# Patient Record
Sex: Male | Born: 1970 | Race: Black or African American | Hispanic: No | Marital: Married | Smoking: Current some day smoker
Health system: Southern US, Community
[De-identification: ages and names within clinical notes are randomized; demographics above are authoritative.]

## PROBLEM LIST (undated history)

## (undated) DIAGNOSIS — E119 Type 2 diabetes mellitus without complications: Secondary | ICD-10-CM

## (undated) DIAGNOSIS — M2550 Pain in unspecified joint: Secondary | ICD-10-CM

## (undated) DIAGNOSIS — M109 Gout, unspecified: Secondary | ICD-10-CM

## (undated) DIAGNOSIS — I10 Essential (primary) hypertension: Secondary | ICD-10-CM

## (undated) DIAGNOSIS — E782 Mixed hyperlipidemia: Secondary | ICD-10-CM

## (undated) DIAGNOSIS — E559 Vitamin D deficiency, unspecified: Secondary | ICD-10-CM

## (undated) HISTORY — DX: Pain in unspecified joint: M25.50

## (undated) HISTORY — DX: Type 2 diabetes mellitus without complications: E11.9

## (undated) HISTORY — DX: Vitamin D deficiency, unspecified: E55.9

## (undated) HISTORY — PX: KNEE ARTHROSCOPY: SUR90

## (undated) HISTORY — PX: OTHER SURGICAL HISTORY: SHX169

## (undated) HISTORY — DX: Essential (primary) hypertension: I10

## (undated) HISTORY — DX: Gout, unspecified: M10.9

## (undated) HISTORY — DX: Mixed hyperlipidemia: E78.2

---

## 1998-07-28 ENCOUNTER — Encounter: Payer: Self-pay | Admitting: Emergency Medicine

## 1998-07-28 ENCOUNTER — Emergency Department (HOSPITAL_COMMUNITY): Admission: EM | Admit: 1998-07-28 | Discharge: 1998-07-28 | Payer: Self-pay | Admitting: Emergency Medicine

## 1998-07-29 ENCOUNTER — Encounter: Admission: RE | Admit: 1998-07-29 | Discharge: 1998-07-29 | Payer: Self-pay | Admitting: *Deleted

## 1998-07-30 ENCOUNTER — Encounter: Admission: RE | Admit: 1998-07-30 | Discharge: 1998-10-28 | Payer: Self-pay | Admitting: Internal Medicine

## 1998-08-09 ENCOUNTER — Emergency Department (HOSPITAL_COMMUNITY): Admission: EM | Admit: 1998-08-09 | Discharge: 1998-08-09 | Payer: Self-pay | Admitting: Emergency Medicine

## 1999-06-25 ENCOUNTER — Emergency Department (HOSPITAL_COMMUNITY): Admission: EM | Admit: 1999-06-25 | Discharge: 1999-06-25 | Payer: Self-pay | Admitting: Emergency Medicine

## 1999-06-25 ENCOUNTER — Encounter: Payer: Self-pay | Admitting: Emergency Medicine

## 1999-12-30 ENCOUNTER — Emergency Department (HOSPITAL_COMMUNITY): Admission: EM | Admit: 1999-12-30 | Discharge: 1999-12-30 | Payer: Self-pay | Admitting: Emergency Medicine

## 1999-12-30 ENCOUNTER — Encounter: Payer: Self-pay | Admitting: Emergency Medicine

## 2000-04-14 ENCOUNTER — Emergency Department (HOSPITAL_COMMUNITY): Admission: EM | Admit: 2000-04-14 | Discharge: 2000-04-15 | Payer: Self-pay | Admitting: Emergency Medicine

## 2000-04-20 ENCOUNTER — Encounter: Payer: Self-pay | Admitting: *Deleted

## 2000-04-20 ENCOUNTER — Ambulatory Visit (HOSPITAL_COMMUNITY): Admission: RE | Admit: 2000-04-20 | Discharge: 2000-04-20 | Payer: Self-pay | Admitting: *Deleted

## 2000-04-21 ENCOUNTER — Observation Stay (HOSPITAL_COMMUNITY): Admission: RE | Admit: 2000-04-21 | Discharge: 2000-04-23 | Payer: Self-pay | Admitting: *Deleted

## 2000-04-21 ENCOUNTER — Encounter: Payer: Self-pay | Admitting: *Deleted

## 2000-04-21 ENCOUNTER — Encounter (INDEPENDENT_AMBULATORY_CARE_PROVIDER_SITE_OTHER): Payer: Self-pay | Admitting: Specialist

## 2000-05-02 ENCOUNTER — Encounter (INDEPENDENT_AMBULATORY_CARE_PROVIDER_SITE_OTHER): Payer: Self-pay | Admitting: Specialist

## 2000-05-02 ENCOUNTER — Inpatient Hospital Stay (HOSPITAL_COMMUNITY): Admission: RE | Admit: 2000-05-02 | Discharge: 2000-05-06 | Payer: Self-pay | Admitting: *Deleted

## 2000-06-13 ENCOUNTER — Ambulatory Visit (HOSPITAL_COMMUNITY): Admission: RE | Admit: 2000-06-13 | Discharge: 2000-06-13 | Payer: Self-pay | Admitting: *Deleted

## 2000-06-13 ENCOUNTER — Encounter: Payer: Self-pay | Admitting: *Deleted

## 2007-04-25 ENCOUNTER — Emergency Department (HOSPITAL_COMMUNITY): Admission: EM | Admit: 2007-04-25 | Discharge: 2007-04-25 | Payer: Self-pay | Admitting: Emergency Medicine

## 2008-03-12 ENCOUNTER — Ambulatory Visit (HOSPITAL_COMMUNITY): Admission: RE | Admit: 2008-03-12 | Discharge: 2008-03-12 | Payer: Self-pay | Admitting: Internal Medicine

## 2008-06-11 IMAGING — CR DG FOOT COMPLETE 3+V*L*
3 series · 3 of 3 positions shown · non-contrast
Comparison: None.

CLINICAL DATA: Trauma 1 week ago.

LEFT FOOT - COMPLETE 3+ VIEW

[view not recorded (1 of 3)]
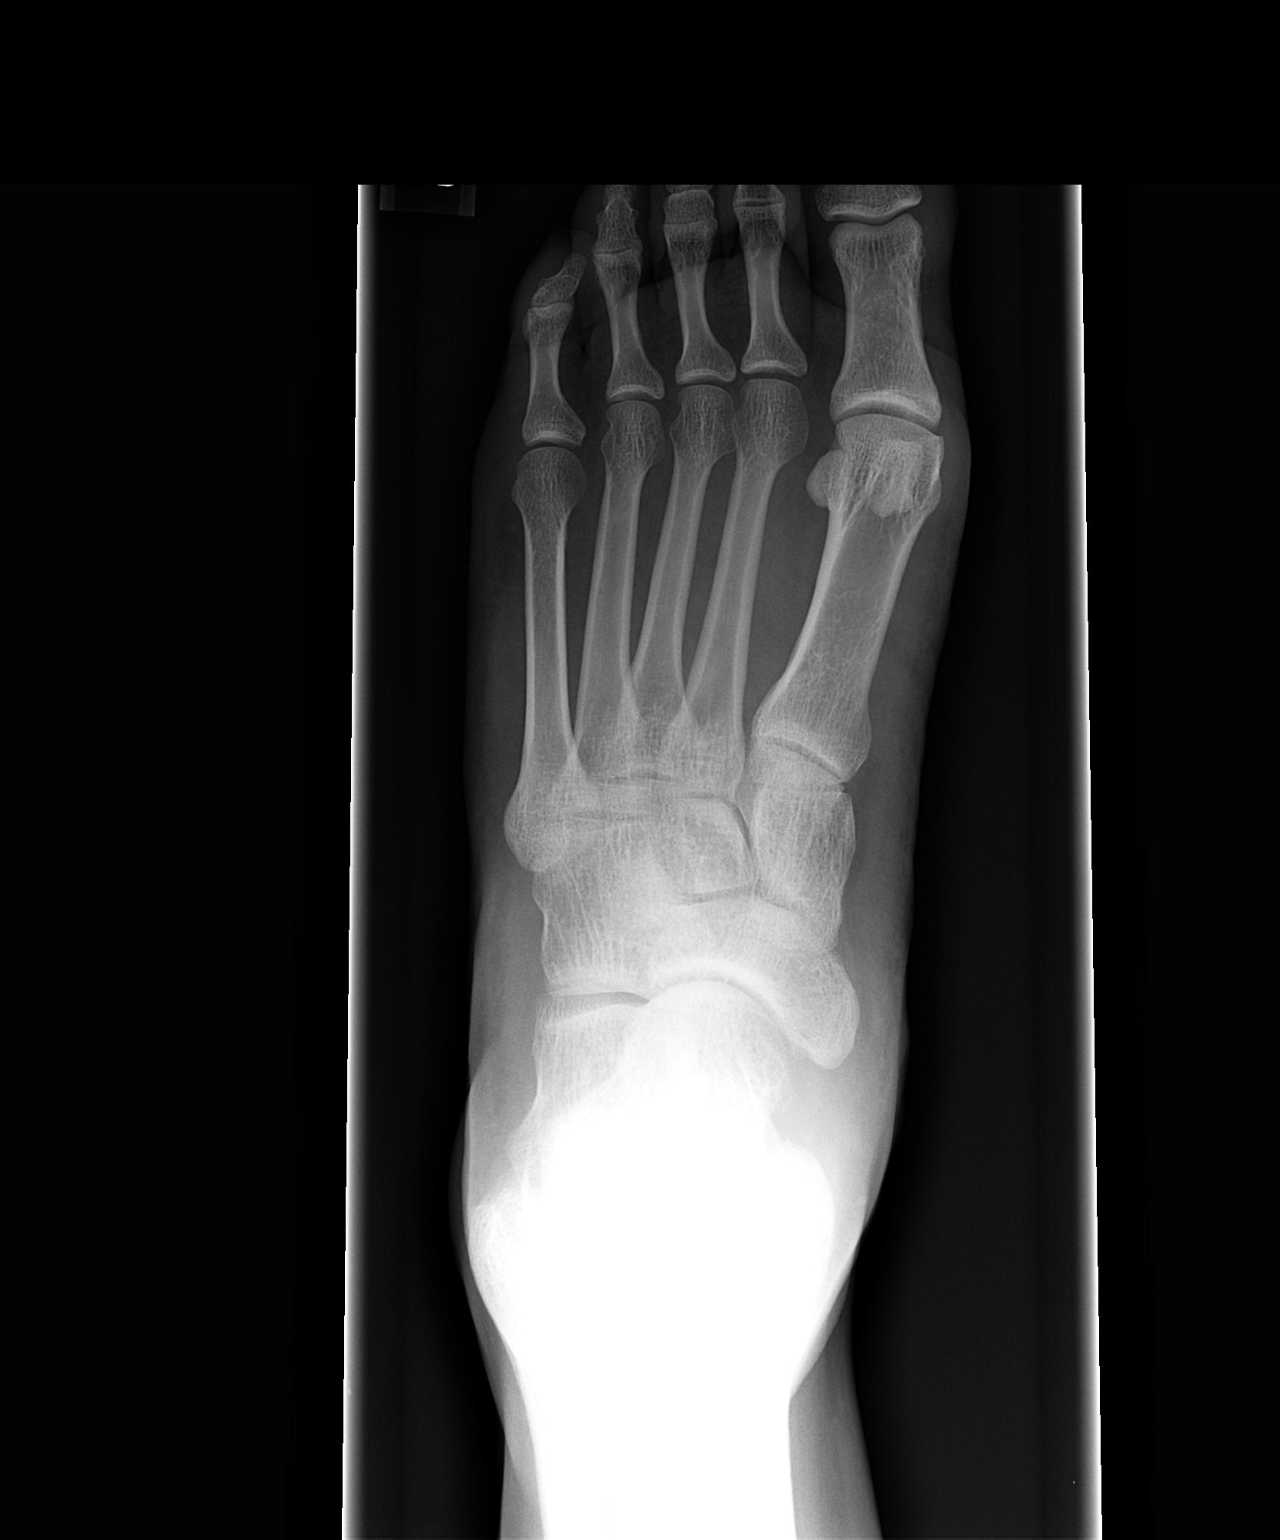

[view not recorded (2 of 3)]
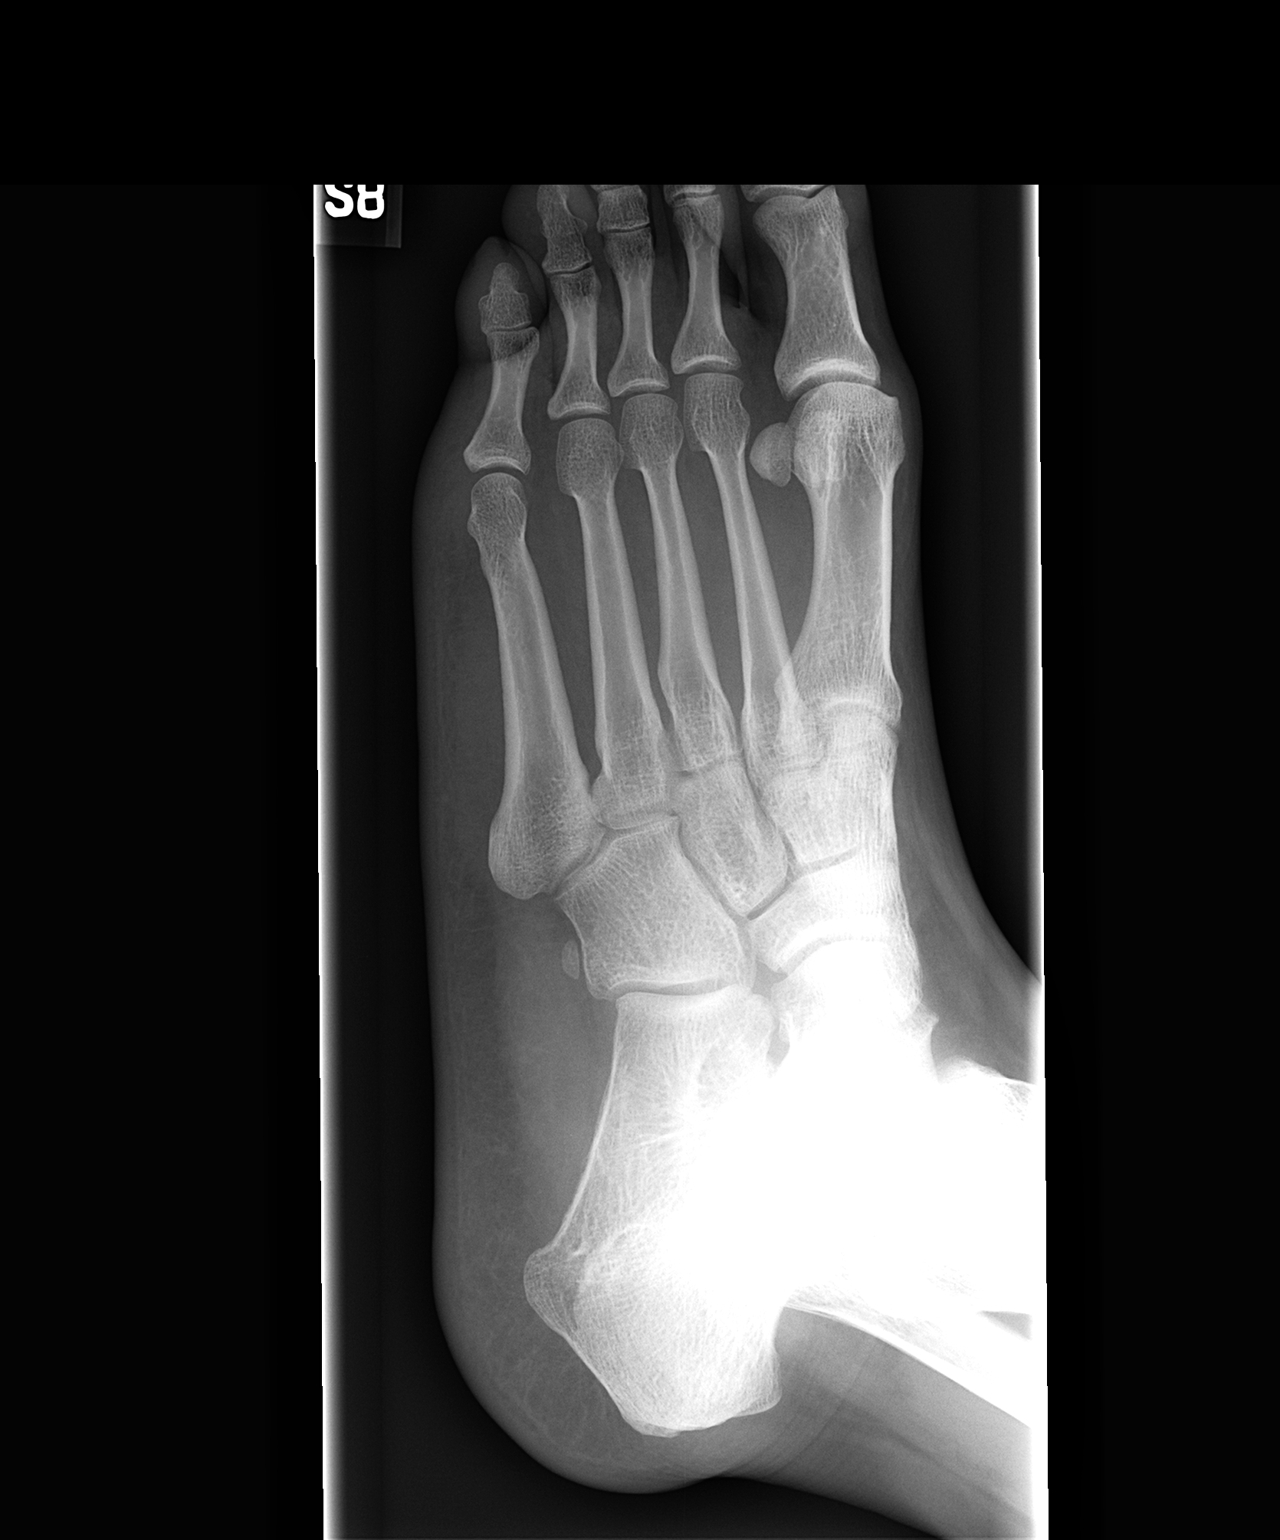

[view not recorded (3 of 3)]
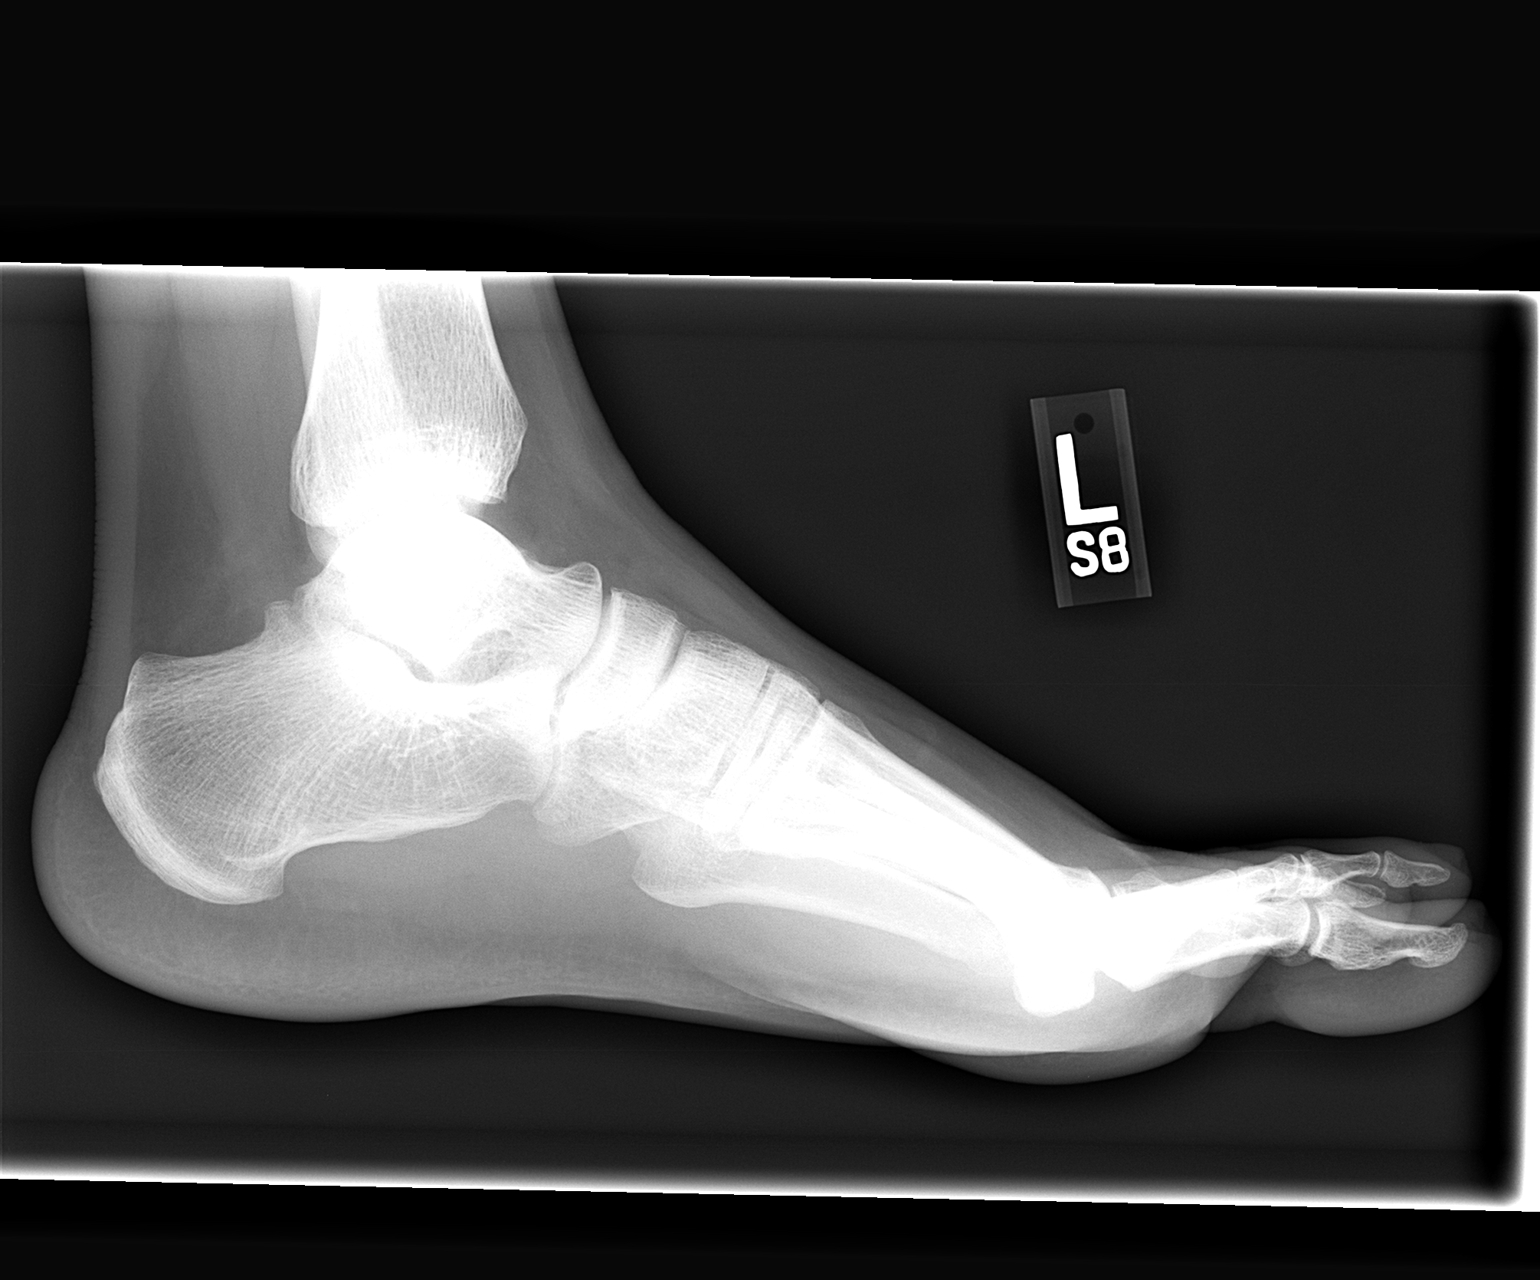

[3 of 3 positions shown; findings below may reference images not displayed]

FINDINGS: No fracture or dislocation.
IMPRESSION: No fracture or dislocation.

## 2011-03-19 NOTE — Discharge Summary (Signed)
[facility]  Patient:    Glen Cuevas, Glen Cuevas                MRN: [id number] Adm. Date:  [id number] Disc. Date: [id number] Attending:  Dalbert Mayotte CCRadene Knee., M.D.                           Discharge Summary  HOSPITAL COURSE:  This 40 year old male with a chief complaint of hematuria, onset January 2001, was admitted for partial cystectomy.  The patient had been previously cystoscoped and biopsied on April 21, 2000 and was found to have a well differentiated large tumor in the left anterior dome which was not possible to reach with the resectoscope.  For that reason the patient was brought back in and on May 02, 2000, a partial cystectomy was carried out with adequate margins around the base of the tumor which was about 2 cm in thickness.  The pathology showed a well differentiated tumor with no evidence of invasion.  There were no additional tumors noted at cystoscopy or at the time of the surgery.  Postoperatively the patient did well.  He was treated initially with Kefzol IV and then with p.o. Cipro.  He was given Jacky Kindle for pain and Tylox for less severe pain.  The patient was ambulated.  His urine cleared after 24 to 48 hours and the patients bowels moved.  He was eating a regular diet and had a low grade temperature which resolved after July 5.  He was discharged on May 06, 2000 afebrile and stable.  PAST MEDICAL HISTORY:  The past history, review of systems and family history reveals that the patient has no known allergies.  He has been on no regular medications except for his antibiotics and his pain meds.  He has had no previous operations or serious illnesses.  His weight has been steady about 223.  REVIEW OF SYSTEMS:  Unremarkable except for a little discomfort in his chest which was investigated and found to have been noncardiac, felt to be related to stress.  FAMILY HISTORY:  Positive for stones  in his mother and there is no diabetes or familial illnesses known to be in the family.  SOCIAL HISTORY:  The patient is single.  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  He quit smoking a couple of weeks ago.  He does not abuse alcohol.  PHYSICAL EXAMINATION:  GENERAL:  Examination reveals a well-developed, well-nourished 40 year old male.  VITAL SIGNS:  Blood pressure 124/85, the other vital signs normal.  HEENT:  Unremarkable.  CARDIORESPIRATORY:  No murmur detected.  He has an occasional rale.  ABDOMEN:  Mildly obese.  A little sensitivity over the left 12th rib but his bone scan was negative as was his IVP performed prior to the biopsy.  GENITALIA:  Uncircumcised penis.  Testes are good size and symmetrical.  The rectal tone reveals a 20 gram soft, symmetrical prostate.  EXTREMITIES:  Unremarkable.  NEUROLOGICAL:  Unremarkable.  LABORATORY DATA:  The laboratory and x-ray reports reveal that the bladder tumor was a low grade papillary transitional cell carcinoma, no invasion.  The postoperative hematocrit was 39, hemoglobin 13.4, white count 9,600.  His chemistries were normal.  Creatinine 1.5.  The patients blood type was O positive.  His urine culture showed no growth.  The patients chest x-ray, IVP and  bone scan done at the time of the biopsy were all within normal limits.  FINAL DIAGNOSIS:  Papillary transitional cell carcinoma, grade 1, noninvasive.  OPERATION:  Partial cystectomy.  PLAN:  Regular diet.  Force fluids.  Light activity.  DISCHARGE MEDICATIONS: 1. Tylox 1 q.4-6h. p.r.n. pain. 2. Cipro 500 b.i.d. 3. Ditropan 5 mg q.4-6h. p.r.n. bladder spasm.  DISCHARGE INSTRUCTIONS:  Return to office in one week for removal of the remainder of his staples, his Penrose drain and Foley catheter.  CONDITION:  Improved. DD:  05/06/00 TD:  05/06/00 Job: [id number]

## 2011-03-19 NOTE — H&P (Signed)
[facility]  Patient:    Glen Cuevas, Glen Cuevas                MRN: [id number] Adm. Date:  [id number] Attending:  Dalbert Mayotte CCRadene Knee., M.D.                         History and Physical  REASON FOR ADMISSION:  This 40 year old male with a chief complaint of bladder tumor is scheduled for 02 May 2000, for partial cystectomy at [facility].  HISTORY OF PRESENT ILLNESS:  This 40 year old male was seen initially in our office on 19 April 2000, and reported that he had had at least three episodes of blood in the urine associated with clots and some discomfort in his left side.  These started in January of 2001.  He had no previous history of stone or urinary tract infection.  The patient was investigated with CT scan which showed no stone, and then he had an IVP which suggested a mass in the bladder, which was large.  For that reason, the patient had cystoscopy and biopsy carried out at [facility] on 21 April 2000, and was found to have a large bladder tumor high in the left anterior dome with a base which could not be reached with a resectoscope.  Biopsies were taken, however, and revealed a well-differentiated transitional cell carcinoma.  No elements of invasion could be identified but the bladder wall and tumor base could not be reached for biopsy.  Postoperatively, the patient did well on Levaquin, gentamicin and his Foley catheter was removed on 22 April 2000.  The patient was discharged 23 April 2000, voiding well, no excess bleeding, and will return for partial cystectomy on 02 May 2000.  The patient did have a cystogram performed postoperatively which confirmed that this was a large tumor in the anterior left dome with a fairly broad base.  The patient also had a bone scan which showed no evidence of metastases and the explantation for his pain, which was in the region of the left 12th rib could not be  elucidated since his IVP and CT scan had not thrown any light on this either.  ALLERGIES:  None.  PRESENT MEDICATIONS:  Includes Levaquin to be followed by cephalexin and he has Vicodin for pain.  PAST SURGICAL AND MEDICAL HISTORY:  Reveals that he has had no previous operations or serious illnesses.  REVIEW OF SYSTEMS:  Weight has been steady about 223.  HEENT:  Unremarkable except for migraine headaches.  CARDIORESPIRATORY:  He has had some chest pain in the past, which was investigated and felt to be related to stress and did not seem to be cardiac in origin.  Occasionally he notes just a little swelling of his feet when he stands for a long period of time.  He denies any asthma or shortness of breath.  GASTROINTESTINAL:  No peptic ulcer disease. No hepatitis but in March he thinks he may have had just a little blood from the rectum.  MUSCULOSKELETAL:  He denies any arthritis, fainting or falling out spells and no arthritis.  FAMILY HISTORY:  Positive for stones in his mother and his father is living and well, age 58.  Mother age 108, has stones.  One brother, two sisters.  No children and no diabetes or familial diseases known to be in the family.  SOCIAL HISTORY:  The patient is single.  Works at [employer], has a Comptroller job doing analysis.  He has quit smoking about a week ago.  He does not abuse alcohol.  PHYSICAL EXAMINATION:  GENERAL:  The physical examination reveals a well-developed, well-nourished 40 year old male with hematuria and bladder tumor.  VITAL SIGNS:  Temperature is 98, pulse 80, respirations 16, blood pressure 124/85.  HEENT:  The ears and tympanic membranes unremarkable.  Eyes react normal to light and accommodation.  Extraocular movements intact.  Pharynx benign. Teeth in good repair.  NECK:  No enlargement or thyroid nodes are palpable.  CHEST:  Clear to percussion and auscultation, occasional rales.  HEART:  Normal sinus rhythm.  No  murmur detected.  ABDOMEN:  No masses, scars or tenderness noted except for sensitivity over the left 12th rib.  No herpetic rashes noted.  GENITALIA:  The penis is circumcised.  Testes are symmetrical, good size and nontender.  RECTAL:  Examination is good.  The prostates is 20 g, symmetrical, soft, may be slightly tenderness.  EXTREMITIES:  No edema.  Good peripheral pulses.  NEUROLOGICAL:  Grossly normal reflexes and sensation.  IMPRESSION: 1. Well-differentiated transitional cell carcinoma of the bladder with a    history of hematuria for six months. 2. Migraine headaches. 3. Pain associated with a left 12th rib, unknown etiology.  PLAN:  For a partial cystectomy on 02 May 2000, [facility] at 7:30 a.m. DD:  04/25/00 TD:  04/25/00 Job: [id number]

## 2011-03-19 NOTE — Discharge Summary (Signed)
[facility]  Patient:    Glen Cuevas, Glen Cuevas                MRN: [id number] Adm. Date:  [id number] Disc. Date: [id number] Attending:  Dalbert Mayotte                           Discharge Summary  HISTORY OF PRESENT ILLNESS:  This 40 year old male was admitted to [facility] on April 21, 2000 with the chief complaint of gross hematuria starting in January 2001.  The patients investigation included a CT scan which did not reveal a stone.  The patient had had some discomfort in his left side about the level of the left 12th rib.  The patient had an intravenous pyelogram performed which did reveal a large filling defect in the bladder.  The patient for that reason was scheduled for cystoscopy and biopsy, and this was carried out under general anesthesia April 21, 2000.  The patient had a large papillary tumor.  It was not possible to totally resect this since it was very high in the left anterior dome and could not reach the base of the tumor with the resectoscope.  Biopsies were obtained, however, and these were found to be well differentiated transitional cell carcinoma with focal areas of grade 2 lesion, but the vast majority of the lesion was grade 1.  The patient had a bone scan which showed no evidence of metastases.  A chest x-ray was considered to be within normal limits.  The patient was advised to have a segmental resection of the involved portion of the bladder and this was thoroughly discussed with the patient who was in agreement.  The past history, review of systems and family history reveal that the patient has no known allergies.  He has been taking Vicodin and Cipro at home.  He has had no operations or serious illnesses.  REVIEW OF SYSTEMS:  His weight has been steady about 220 and he does have migraine headaches, but investigation of some recent chest discomfort was thought to be noncardiac.  The remainder of his review of  systems has been unremarkable.  He thinks he might have seen just a trace of blood from the bowels on one occasion.  FAMILY HISTORY:  Family history is positive for stones in his mother, but there are no other familial diseases to his knowledge and no diabetes mellitus.  SOCIAL HISTORY:  The patient is single.  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  He quit smoking about a week ago.  He does not use or abuse alcohol.  PHYSICAL EXAMINATION:  GENERAL:  The physical examination reveals a well-developed, well-nourished male age 40.  VITAL SIGNS:  Blood pressure 124/85 mmHg.  He is afebrile.  Pulse 85. Respirations 16.  HEENT:  Unremarkable.  CARDIORESPIRATORY:  No murmur detected.  LUNGS:  Clear.  ABDOMEN:  Abdomen free of masses and tenderness.  There is a little discomfort on pressure over the left 12th rib, but no mass is noted here.  GENITALIA:  The penis is circumcised.  The testes are symmetrical, good size, nontender.  The rectal tone is good.  Prostate is 20 grams, symmetrical, soft, slightly tender.  EXTREMITIES:  Normal.  NEUROLOGIC:  Normal.  LABORATORY DATA:  The patients laboratory investigation reveals the creatinine to be 1.2.  The chemistries are all normal.  The prothrombin time was 13.8, INR 1.1,  PTT 30, hematocrit 44, hemoglobin 14.5, white count 5,700 with 264,000 platelets.  The urinalysis is normal except for an occasional red cells.  Urine culture with no growth.  RADIOLOGY STUDIES:  The patients chest x-ray was considered to be within normal limits.  His IVP showed a bladder mass, but no upper tract stones or obstruction.  The bone scan was considered to be within normal limits.  EKG:  His EKG showed a normal sinus rhythm with nonspecific T-wave changes.  HOSPITAL COURSE:  The patients postoperative course was benign.  He remained afebrile on gentamicin and Levaquin.  On April 22, 2000, his urine was noted to be clear.   He was afebrile.  His Foley catheter was removed and he voided without difficulty or bleeding and was ambulated.  He was placed on a regular diet.  The patient was discharged on April 23, 2000 and will be brought back for segmental resection of the bladder.  DIAGNOSES: 1. 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 2. Migraine headaches. 3. Discomfort over the left 12th rib, etiology unknown.  OPERATION PERFORMED:  Transurethral biopsy and partial resection of bladder tumor.  PLAN:  Regular diet.  Force fluids.  Light activity.  Cephalexin 500 t.i.d. Vicodin 1 or 2 q.4-6h. p.r.n. for pain.  Call office on Monday for scheduling partial cystectomy.  CONDITION ON DISCHARGE:  Improved. DD:  04/22/00 TD:  04/24/00 Job: [id number]

## 2011-03-19 NOTE — H&P (Signed)
[facility]  Patient:    Glen Cuevas, Glen Cuevas                    MRN: [id number] Adm. Date:  [id number] Disc. Date: [id number] Attending:  Benny Lennert CC:         Radene Knee., M.D.                         History and Physical  DATE OF BIRTH:  Apr 10, 1971.  REASON FOR ADMISSION:  This 40 year old male is scheduled at [facility], April 21, 2000, for cystoscopy, possible retrograde pyelogram or biopsy for investigation of a chief complaint of hematuria.  PRESENT ILLNESS:  This 40 year old male was seen initially in our office on April 19, 2000 and reported that he had had at least three episodes of blood in the urine associated with clots and left-sided pain starting in January of 2001.  He had another episode in March and then was seen April 15, 2000 in the [facility] Emergency Room with clots and blood in the urine and had a CT scan, stone protocol, which did not show a stone and did not show hydronephrosis but did suggest a possibility of clots in the bladder.  The patient was referred to our office and placed on Cipro 500 mg b.i.d. and Vicodin for pain.  ALLERGIES:  None.  PRESENT MEDICATIONS:  Hydrocodone and Cipro.  PAST SURGICAL HISTORY AND MEDICAL HISTORY:  He has had no operations and no serious illnesses.  REVIEW OF SYSTEMS:  Weight has been steady, about 223 pounds.  HEENT: Unremarkable except for migraine headaches.  CARDIORESPIRATORY:  He has had some chest pain which was thought to be noncardiac and related to stress on the job.  He occasionally has noticed just a little swelling of his feet.  He denies any asthma or shortness of breath.  GI:  No peptic ulcer disease, no hepatitis, but in March, he thinks he did have a little blood from the rectum. MUSCULOSKELETAL/NEUROLOGIC:  He denies any arthritis or fainting or falling out spells.  FAMILY HISTORY:  Positive for stones in his mother and his father is living and well, age  32; mother, age 36, has stones.  One brother.  Two sisters.  No children.  SOCIAL HISTORY:  The patient is single.  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  He has quit smoking, about a week ago.  He does not use alcohol.  PHYSICAL EXAMINATION:  GENERAL:  Physical examination reveals a well-developed, well-nourished 40 year old male with pain in the left side.  VITAL SIGNS:  The temperature is 98, the pulse 82, the respirations 16 and blood pressure 124/85.  HEENT:  The ears and tympanic membranes are unremarkable.  Eyes react normally to light and accommodation.  Extraocular movements are intact.  Pharynx benign.  Teeth in good repair.  NECK:  No enlargement of the thyroid.  The nodes are palpable.  CHEST:  Clear to percussion and auscultation with a few rales.  HEART:  Normal sinus rhythm.  No murmur detected.  ABDOMEN:  No masses or scars are noted.  He is tender over the left 12th rib.  GENITALIA:  The penis is circumcised.  The testes are symmetrical, good size, nontender.  RECTAL:  The rectal tone is good.  The prostate is 20 g, symmetrical, soft and maybe slightly tender.  EXTREMITIES:  No edema.  Good peripheral pulses.  NEUROLOGIC:  Grossly normal reflexes and sensation.  IMPRESSION: 1. Gross hematuria. 2. Left-sided pain. 3. Migraine headaches.  PLAN:  The plan is for the patient to get an IVP.  We will do cystoscopy, possibly left retrograde pyelograms, possible biopsy at [facility] on April 21, 2000. DD:  04/19/00 TD:  04/20/00 Job: [id number]

## 2011-03-19 NOTE — Op Note (Signed)
[facility]  Patient:    Glen Cuevas, Glen Cuevas                MRN: [id number] Proc. Date: 04/21/00 Adm. Date:  [id number] Attending:  Dalbert Mayotte CCRadene Knee., M.D.                           Operative Report  PREOPERATIVE DIAGNOSIS: 1. Bladder tumor. 2. Hematuria.  POSTOPERATIVE DIAGNOSIS: 1. Bladder tumor. 2. Hematuria.  OPERATIONS PERFORMED:  Cystoscopy, bladder biopsy, frozen section and transurethral resection of bladder tumor incomplete.  DESCRIPTION OF PROCEDURE:  This 40 year old male brought to the operating room underwent successful induction of general anesthesia after receiving IV gentamycin. He was prepped and draped in the lithotomy position and the bladder inspected with a #25 cystourethroscope using 12 and 70 degree lenses. The patient had median bar and early lateral lobe hypertrophy with a little anterior notching. The right and left ureteral orifices were normal. No stone was noted in the bladder. The patient did, however, have a large tumor which involved the left anterior lateral walls and thick frons and the cold cut biopsy forceps was used to remove a specimen which was sent for pathologic examination and the report was either an inverted papilloma or well differentiated transitional cell tumor and final pathology will await the permanent sections. The urethra was then dilated to 30 [place], a #28 Olympus resectoscope was introduced and the tumor that was in easy reach was resected. The base of the tumor seemed to be too high to reach with the resectoscope high on the left lateral wall where it joins the anterior wall and it was unable to tell whether it was a thick or narrow stalk. A large amount of tumor was removed, sent for pathologic examination and bleeders were carefully cauterized and the bladder drained with a #22 5 cc Foley catheter. The patient returned to the recovery area in a stable  condition.  PLAN:  Await the final pathology. The patient will either need repeat transurethral resections until all tumor is gone or he will need an open resection and partial cystectomy of this area. DD:  04/21/00 TD:  04/22/00 Job: [id number]

## 2011-08-18 LAB — I-STAT 8, (EC8 V) (CONVERTED LAB)
BUN: 8
Chloride: 106
Glucose, Bld: 89
Hemoglobin: 14.6
Potassium: 3.8
Sodium: 141

## 2021-01-02 DIAGNOSIS — Z720 Tobacco use: Secondary | ICD-10-CM | POA: Insufficient documentation

## 2021-07-08 DIAGNOSIS — E119 Type 2 diabetes mellitus without complications: Secondary | ICD-10-CM | POA: Insufficient documentation

## 2021-07-08 DIAGNOSIS — M2550 Pain in unspecified joint: Secondary | ICD-10-CM | POA: Insufficient documentation

## 2021-11-20 ENCOUNTER — Ambulatory Visit (INDEPENDENT_AMBULATORY_CARE_PROVIDER_SITE_OTHER): Payer: BC Managed Care – PPO | Admitting: Neurology

## 2021-11-20 ENCOUNTER — Encounter: Payer: Self-pay | Admitting: Neurology

## 2021-11-20 VITALS — BP 172/110 | HR 95 | Ht 72.0 in | Wt 247.8 lb

## 2021-11-20 DIAGNOSIS — G4730 Sleep apnea, unspecified: Secondary | ICD-10-CM

## 2021-11-20 DIAGNOSIS — G4710 Hypersomnia, unspecified: Secondary | ICD-10-CM

## 2021-11-20 DIAGNOSIS — G4726 Circadian rhythm sleep disorder, shift work type: Secondary | ICD-10-CM

## 2021-11-20 DIAGNOSIS — R0683 Snoring: Secondary | ICD-10-CM

## 2021-11-20 NOTE — Patient Instructions (Signed)
Screening for Sleep Apnea Sleep apnea is a condition in which breathing pauses or becomes shallow during sleep. Sleep apnea screening is a test to determine if you are at risk for sleep apnea. The test includes a series of questions. It will only takes a few minutes. Your health care provider may ask you to have this test in preparation for surgery or as part of a physical exam. What are the symptoms of sleep apnea? Common symptoms of sleep apnea include: Snoring. Waking up often at night. Daytime sleepiness. Pauses in breathing. Choking or gasping during sleep. Irritability. Forgetfulness. Trouble thinking clearly. Depression. Personality changes. Most people with sleep apnea do not know that they have it. What are the advantages of sleep apnea screening? Getting screened for sleep apnea can help: Ensure your safety. It is important for your health care providers to know whether or not you have sleep apnea, especially if you are having surgery or have other long-term (chronic) health conditions. Improve your health and allow you to get a better night's rest. Restful sleep can help you: Have more energy. Lose weight. Improve high blood pressure. Improve diabetes management. Prevent stroke. Prevent car accidents. What happens during the screening? Screening usually includes being asked a list of questions about your sleep quality. Some questions you may be asked include: Do you snore? Is your sleep restless? Do you have daytime sleepiness? Has a partner or spouse told you that you stop breathing during sleep? Have you had trouble concentrating or memory loss? What is your age? What is your neck circumference? To measure your neck, keep your back straight and gently wrap the tape measure around your neck. Put the tape measure at the middle of your neck, between your chin and collarbone. What is your sex assigned at birth? Do you have or are you being treated for high blood  pressure? If your screening test is positive, you are at risk for the condition. Further testing may be needed to confirm a diagnosis of sleep apnea. Where to find more information You can find screening tools online or at your health care clinic. For more information about sleep apnea screening and healthy sleep, visit these websites: Centers for Disease Control and Prevention: www.cdc.gov American Sleep Apnea Association: www.sleepapnea.org Contact a health care provider if: You think that you may have sleep apnea. Summary Sleep apnea screening can help determine if you are at risk for sleep apnea. It is important for your health care providers to know whether or not you have sleep apnea, especially if you are having surgery or have other chronic health conditions. You may be asked to take a screening test for sleep apnea in preparation for surgery or as part of a physical exam. This information is not intended to replace advice given to you by your health care provider. Make sure you discuss any questions you have with your health care provider. Document Revised: 09/26/2020 Document Reviewed: 09/26/2020 Elsevier Patient Education  2022 [employer]  

## 2021-11-20 NOTE — Progress Notes (Signed)
SLEEP MEDICINE CLINIC    Provider:  Larey Seat, MD  Primary Care Physician:  Beola Cord, [place] [id number]     Referring Provider: Beola Cord, Fnp [phone] [place] [address]          Chief Complaint according to patient   Patient presents with:     New Patient (Initial Visit)           HISTORY OF PRESENT ILLNESS:  11-20-2021 Glen Cuevas is a 51 y.o.  African American male patient who was seen here on 11/20/2021 from PCP  for a Sleep consultation. .  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 He struggles with HTN. He is currently out of work for a ligament  and fracture /damage at the heel. He sleeps only 5-6 hours at night. "   [place]  has a past medical history of Essential hypertension, Gout, Mixed hyperlipidemia, Multiple joint pain, Type 2 diabetes mellitus ([place]), Type 2 diabetes mellitus ([place]), and Vitamin D deficiency.    Sleep relevant medical history: Nocturia, chronic nasal congestion- exposure to powder and dust at work.    Family medical /sleep history: DM in father and sister,  mother with OSA, died of lung cancer.   Social history:  Patient is working in a Psychologist, sport and exercise-   and lives in a household with spouse , daughter is still at home ( age 41)  and one dog. The patient currently works in day shifts( night/ rotating,) Tobacco use- quit 08-2021.  ETOH use ; none ,  Caffeine intake in form of Coffee( 1 cup a day) Soda( 2 a day) Tea ( 2-4 a day ).      Sleep habits are as follows: The patient's dinner time is between 8-9 PM. The patient goes to bed at 10 PM and continues to sleep for 5-6 hours, wakes for 1 bathroom break. Left foot pain- .   The preferred sleep position is sideways, with the support of 1 pillow.  Dreams are reportedly rare.   4.30  AM is the usual rise time. The patient wakes up with an alarm.  He reports not feeling refreshed or restored in AM, with symptoms such as dry mouth, , and residual fatigue. Naps are taken infrequently, since he is now out of work - lasting from 20 to 60 minutes, very refreshing,    Review of Systems: Out of a complete 14 system review, the patient complains of only the following symptoms, and all other reviewed systems are negative.:  Fatigue, sleepiness , snoring, apneas, limited sleep time, former night shift.    How likely are you to doze in the following situations: 0 = not likely, 1 = slight chance, 2 = moderate chance, 3 = high chance   Sitting and Reading? Watching Television? Sitting inactive in a public place (theater or meeting)? As a passenger in a car for an hour without a break? Lying down in the afternoon when circumstances permit? Sitting and talking to someone? Sitting quietly after lunch without alcohol? In a car, while stopped for a few minutes in traffic?   Total = 5/ 24 points   FSS endorsed at 37/ 63 points.   Social History   Socioeconomic History   Marital status: Married    Spouse name: Not on file  Number of children: 2   Years of education: Not on file   Highest education level: Not on file  Occupational History   Not on file  Tobacco Use   Smoking status: Former    Packs/day: 0.50    Types: Cigarettes    Quit date: 2022    Years since quitting: 1.0   Smokeless tobacco: Never  Vaping Use   Vaping Use: Never used  Substance and Sexual Activity   Alcohol use: Never   Drug use: Never   Sexual activity: Not on file  Other Topics Concern   Not on file  Social History Narrative   Caffiene" 1 cup daily. Soda's 1.5 bottles.  Working: not at this time, injury. College.  Married.2 kids.   Social Determinants of Health   Financial Resource Strain: Not on file  Food Insecurity: Not on file  Transportation Needs: Not on file  Physical  Activity: Not on file  Stress: Not on file  Social Connections: Not on file    Family History  Problem Relation Age of Onset   Lung cancer Mother    Diabetes Father    Hypertension Father    Kidney disease Sister     Past Medical History:  Diagnosis Date   Essential hypertension    Gout    Mixed hyperlipidemia    Multiple joint pain    Type 2 diabetes mellitus ([place])    Type 2 diabetes mellitus ([place])    Vitamin D deficiency     Past Surgical History:  Procedure Laterality Date   KNEE ARTHROSCOPY     tumor removed     bladder     Current Outpatient Medications on File Prior to Visit  Medication Sig Dispense Refill   allopurinol (ZYLOPRIM) 300 MG tablet Take 300 mg by mouth daily.     amLODipine (NORVASC) 10 MG tablet Take 10 mg by mouth daily.     Cholecalciferol 1.25 MG (50000 UT) WAFR Take by mouth once a week.     lisinopril (ZESTRIL) 30 MG tablet Take 30 mg by mouth daily.     meloxicam (MOBIC) 15 MG tablet Take 15 mg by mouth daily as needed.     metFORMIN (GLUCOPHAGE) 1000 MG tablet Take 1,000 mg by mouth 2 (two) times daily with a meal.     pravastatin (PRAVACHOL) 20 MG tablet Take 20 mg by mouth daily.     No current facility-administered medications on file prior to visit.    No Known Allergies  Physical exam:  Today's Vitals   11/20/21 0856  BP: (!) 172/110  Pulse: 95  Weight: 247 lb 12.8 oz (112.4 kg)  Height: 6' (1.829 m)   Body mass index is 33.61 kg/m.   Wt Readings from Last 3 Encounters:  11/20/21 247 lb 12.8 oz (112.4 kg)     Ht Readings from Last 3 Encounters:  11/20/21 6' (1.829 m)      General: The patient is awake, alert and appears not in acute distress. The patient is well groomed. Head: Normocephalic, atraumatic. Neck is supple.  Mallampati 2-3,  neck circumference:18  inches .  Nasal airflow not patent.  Retrognathia is not seen.  Dental status: biological  Cardiovascular:  Regular rate and cardiac rhythm by pulse,   without distended neck veins. Respiratory: Lungs are clear to auscultation.  Skin:  Without evidence of ankle edema, or rash. Trunk: The patient's posture is erect.   Neurologic exam : The patient is awake and alert, oriented to  place and time.   Memory subjective described as intact.  Attention span & concentration ability appears normal.  Speech is fluent,  without  dysarthria, dysphonia or aphasia.  Mood and affect are appropriate.   Cranial nerves: no loss of smell or taste reported  Pupils are equal , small in size, round and briskly reactive to light. Funduscopic exam deferred..  Extraocular movements in vertical and horizontal planes were intact and without nystagmus. No Diplopia. Visual fields by finger perimetry are intact. Hearing was intact to soft voice and finger rubbing.    Facial sensation intact to fine touch.  Facial motor strength is symmetric and tongue and uvula move midline.  Neck ROM : rotation, tilt and flexion extension were normal for age and shoulder shrug was symmetrical.    Motor exam:  Symmetric bulk, tone and ROM.   Normal tone without cog- wheeling, symmetric grip strength .   Sensory:  Fine touch and vibration were tested  and  normal.  Proprioception tested in the upper extremities was normal.   Coordination: Rapid alternating movements in the fingers/hands were of normal speed.  The Finger-to-nose maneuver was intact without evidence of ataxia, dysmetria or tremor.   Gait and station: Patient could rise unassisted from a seated position, walked without assistive device.  Stance is of normal width/ base . LEFT FOOT IN BOOT_ Toe and heel walk were deferred.  Deep tendon reflexes: in the  upper and lower extremities are symmetric and intact.  Babinski response was deferred.      After spending a total time of  40  minutes: including paper referral-  face to face and additional time for physical and neurologic examination, review of laboratory  studies,  personal review of imaging studies, reports and results of other testing and review of referral information / records as far as provided in visit, I have established the following assessments:  1)  snoring and apnea witnessed 2)  before his medical leave  , he was excessively sleepy, and more sleep deprived.  3)  HTN, uncontrolled. Takes naproxen, which may contribute.    My Plan is to proceed with:  1) HST to screen for apnea. I see no need for PSG.  2) sleep hygiene - it is hard for him to get enough sleep time. Long commute.  3) DM and HTN may benefit from apnea treatment.   I would like to thank Beola Cord, FNP and Beola Cord, Fnp [address] [place] [address] for allowing me to meet with and to take care of this pleasant patient.    I plan to follow up either personally or through our NP within 2-4  month. .  Electronically signed by: Larey Seat, MD 11/20/2021 9:12 AM  [facility] Board certified by The [employer] of Sleep Medicine and Diplomate of the [employer] of Sleep Medicine. Board certified In Neurology through the [place], Fellow of the [employer] of Neurology. Medical Director of [facility].

## 2021-11-26 ENCOUNTER — Telehealth: Payer: Self-pay

## 2021-11-26 NOTE — Telephone Encounter (Signed)
LVM for pt to call me back to schedule sleep study  

## 2021-12-07 ENCOUNTER — Ambulatory Visit (INDEPENDENT_AMBULATORY_CARE_PROVIDER_SITE_OTHER): Payer: BC Managed Care – PPO | Admitting: Neurology

## 2021-12-07 DIAGNOSIS — G4733 Obstructive sleep apnea (adult) (pediatric): Secondary | ICD-10-CM | POA: Diagnosis not present

## 2021-12-07 DIAGNOSIS — R0683 Snoring: Secondary | ICD-10-CM

## 2021-12-07 DIAGNOSIS — G4726 Circadian rhythm sleep disorder, shift work type: Secondary | ICD-10-CM

## 2021-12-07 DIAGNOSIS — G4710 Hypersomnia, unspecified: Secondary | ICD-10-CM

## 2021-12-08 NOTE — Progress Notes (Signed)
[facility]   HOME SLEEP TEST REPORT ( by Watch PAT)   STUDY DATE:  data loaded 12-08-2021   ORDERING CLINICIAN: Melvyn Novas, MD  REFERRING CLINICIAN:  Tacy Learn, FNP [address] [place],  [place] [address]    CLINICAL INFORMATION/HISTORY: Glen Cuevas is a 51 y.o.  African American male patient who was seen here on 11/20/2021 for a Sleep consultation requested by PCP  Chief concern according to patient :  "My wife has noted my loud snoring and witnessed apneas". 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 He struggles with HTN. He is currently out of work for a ligament and fracture /damage at the heel. He sleeps only 5-6 hours at night. " [name]  has a medical history of Essential hypertension, Gouty arthritis , Mixed hyperlipidemia, joint pain, Type 2 diabetes mellitus ([facility]), and Vitamin D deficiency. Nocturia, chronic nasal congestion- exposure to powder and dust at work.   Family medical /sleep history: DM in father and sister, mother with OSA, who died of lung cancer.   Epworth sleepiness score: 5/24. FSS at 37/ 63 - elevated    BMI: 33.7 kg/m   Neck Circumference: 18"   FINDINGS:   Sleep Summary:   Total Recording Time (hours, min):     7 hours and 8 minutes of which 5 hours and 1 minute were the calculated total sleep time.  Of this total sleep time, 17.6% were REM sleep time.                                 Respiratory Indices:   Calculated pAHI (per hour):   4.9/h                          REM pAHI:   7.8/h                                              NREM pAHI: 4.0/h                            Positional AHI: Strong positional dependency was noted in supine sleep the AHI was 45/h but the patient only spent 14 minutes in the sleep position.  Sleeping on the right or left side was associated with an AHI of 2.5/h.  Snoring level was of the mean volume of 40 dB and  only accompanied 4.3% of total sleep time.                                                  Oxygen Saturation Statistics:   O2 Saturation Range (%):   Varied between a nadir of 84% and a maximum of 99% with a mean value of 95% oxygen saturation.                                    O2 Saturation (minutes) <89%: 0.1 minutes  Pulse Rate Statistics:               Pulse Range: Varied between 56 and 100 bpm with a mean heart rate of 79 bpm the strongest variability of heart rate was noted during REM sleep.               IMPRESSION:  This HST did not show enough apnea to intervene.   An AHI of less than 10 is considered very mild, an AHI of less than 5 is not considered abnormal.    RECOMMENDATION: Based on this home sleep test, there would be no CPAP, no snoring intervention needed.  I wonder if the times this patient's wife has noted him to snore and have apnea could be correlated to a nasal airflow obstruction may be in times of a cold or sinusitis or ,may be depending on the intake of muscle relaxants or pain medication which can exacerbate apnea.    However, upon this baseline by home sleep test, no follow-up in our sleep clinic will be necessary    INTERPRETING PHYSICIAN:   Melvyn Novas, MD   Medical Director of [facility] at [facility].

## 2021-12-25 NOTE — Progress Notes (Signed)
IMPRESSION:  This HST did not show enough apnea to intervene.   An AHI of less than 10 is considered very mild, an AHI of less than 5 is not considered abnormal. The patient already has avoided the right side sleep position which was associated with high apnea.     RECOMMENDATION: Based on this home sleep test, there would be no CPAP, no snoring intervention needed.  Avoiding right sided sleep is recommended.  I wonder if the times this patient's wife has noted him to snore and have apnea could be correlated to a nasal airflow obstruction / a cold or sinusitis , and maybe depending on the intake of muscle relaxants or pain medication which can exacerbate apnea.    However, upon this baseline by home sleep test, no follow-up in our sleep clinic would be necessary.

## 2021-12-25 NOTE — Procedures (Signed)
[facility] TEST REPORT ( by Watch PAT)   STUDY DATE:  data loaded 12-08-2021   ORDERING CLINICIAN: Larey Seat, MD  REFERRING CLINICIAN:  Beola Cord, [place] [address] [place],  [place] [address]    CLINICAL INFORMATION/HISTORY: Glen Cuevas is a 51 y.o.  African American male patient who was seen here on 11/20/2021 for a Sleep consultation requested by PCP  Chief concern according to patient :  "My wife has noted my loud snoring and witnessed apneas". 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 He struggles with HTN. He is currently out of work for a ligament and fracture /damage at the heel. He sleeps only 5-6 hours at night. " [name]  has a medical history of Essential hypertension, Gouty arthritis , Mixed hyperlipidemia, joint pain, Type 2 diabetes mellitus ([place]), and Vitamin D deficiency. Nocturia, chronic nasal congestion- exposure to powder and dust at work.   Family medical /sleep history: DM in father and sister, mother with OSA, who died of lung cancer.   Epworth sleepiness score: 5/24. FSS at 37/ 63 - elevated    BMI: 33.7 kg/m   Neck Circumference: 18"   FINDINGS:   Sleep Summary:   Total Recording Time (hours, min):     7 hours and 8 minutes of which 5 hours and 1 minute were the calculated total sleep time.  Of this total sleep time, 17.6% were REM sleep time.                                 Respiratory Indices:   Calculated pAHI (per hour):   4.9/h                          REM pAHI:   7.8/h                                              NREM pAHI: 4.0/h                            Positional AHI: Strong positional dependency was noted in supine sleep the AHI was 45/h but the patient only spent 14 minutes in the sleep position.  Sleeping on the right or left side was associated with an AHI of 2.5/h.  Snoring level was of the mean volume of 40 dB and only  accompanied 4.3% of total sleep time.                                                  Oxygen Saturation Statistics:   O2 Saturation Range (%):   Varied between a nadir of 84% and a maximum of 99% with a mean value of 95% oxygen saturation.                                    O2 Saturation (minutes) <89%: 0.1 minutes  Pulse Rate Statistics:               Pulse Range: Varied between 56 and 100 bpm with a mean heart rate of 79 bpm the strongest variability of heart rate was noted during REM sleep.               IMPRESSION:  This HST did not show enough apnea to intervene.   An AHI of less than 10 is considered very mild, an AHI of less than 5 is not considered abnormal.    RECOMMENDATION: Based on this home sleep test, there would be no CPAP, no snoring intervention needed.  I wonder if the times this patient's wife has noted him to snore and have apnea could be correlated to a nasal airflow obstruction may be in times of a cold or sinusitis or ,may be depending on the intake of muscle relaxants or pain medication which can exacerbate apnea.    However, upon this baseline by home sleep test, no follow-up in our sleep clinic would be necessary.    INTERPRETING PHYSICIAN:   Larey Seat, MD   Medical Director of [facility] at [facility].

## 2021-12-25 NOTE — Progress Notes (Signed)
Return to primary care: Normal HST. IMPRESSION:  This HST did not show enough apnea to intervene.   An AHI of less than 10 is considered very mild, an AHI of less than 5 is not considered abnormal.    RECOMMENDATION: Based on this home sleep test, there would be no CPAP, no snoring intervention needed.  I wonder if the times this patient's wife has noted him to snore and have apnea could be correlated to a nasal airflow obstruction may be in times of a cold or sinusitis or ,may be depending on the intake of muscle relaxants or pain medication which can exacerbate apnea.    However, upon this baseline by home sleep test, no follow-up in our sleep clinic would be necessary.

## 2021-12-28 ENCOUNTER — Telehealth: Payer: Self-pay

## 2021-12-28 NOTE — Telephone Encounter (Signed)
-----   Message from Melvyn Novas, MD sent at 12/25/2021  2:45 PM EST ----- Return to primary care: Normal HST. IMPRESSION:  This HST did not show enough apnea to intervene.   An AHI of less than 10 is considered very mild, an AHI of less than 5 is not considered abnormal.    RECOMMENDATION: Based on this home sleep test, there would be no CPAP, no snoring intervention needed.  I wonder if the times this patient's wife has noted him to snore and have apnea could be correlated to a nasal airflow obstruction may be in times of a cold or sinusitis or ,may be depending on the intake of muscle relaxants or pain medication which can exacerbate apnea.    However, upon this baseline by home sleep test, no follow-up in our sleep clinic would be necessary.

## 2021-12-28 NOTE — Telephone Encounter (Signed)
I called patient to discuss. No answer, left a message asking him to call me back. If patient calls back another day please send to POD 1.

## 2022-01-06 NOTE — Telephone Encounter (Signed)
Called and spoke with pt about results per Dr. Oliva Bustard note. He verbalized understanding.  ?

## 2022-03-30 ENCOUNTER — Telehealth: Payer: Self-pay

## 2022-03-30 NOTE — Telephone Encounter (Signed)
Called and LVM. This appt should have been cancelled as pt does not need have sleep apnea. per Dr. Brett Fairy no f/u is need in our sleep clinic.

## 2022-03-31 ENCOUNTER — Ambulatory Visit: Payer: BC Managed Care – PPO | Admitting: Family Medicine

## 2022-03-31 NOTE — Progress Notes (Deleted)
No chief complaint on file.   HISTORY OF PRESENT ILLNESS:  03/31/22 ALL:  Glen Cuevas is a 51 y.o. male here today for follow up. He was seen in consult with Dr Vickey Huger 11/2021. HST 12/2021 showed total AHI 4.9/hr with strong positional supine sleep AHI 45/hr for total time of 14 minutes. No intervention with PAP therapy recommended.    HISTORY (copied from Dr Dohmeier's previous note)  Glen Cuevas is a 51 y.o.  African American male patient who was seen here on 11/20/2021 from PCP  for a Sleep consultation. .  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 He struggles with HTN. He is currently out of work for a ligament  and fracture /damage at the heel. He sleeps only 5-6 hours at night. "   Glen Cuevas  has a past medical history of Essential hypertension, Gout, Mixed hyperlipidemia, Multiple joint pain, Type 2 diabetes mellitus (HCC), Type 2 diabetes mellitus (HCC), and Vitamin D deficiency.    Sleep relevant medical history: Nocturia, chronic nasal congestion- exposure to powder and dust at work.    Family medical /sleep history: DM in father and sister,  mother with OSA, died of lung cancer.   Social history:  Patient is working in a [employer] forces technical [employer]-   and lives in a household with spouse , daughter is still at home ( age 88)  and one dog. The patient currently works in day shifts( night/ rotating,) Tobacco use- quit 08-2021.  ETOH use ; none ,  Caffeine intake in form of Coffee( 1 cup a day) Soda( 2 a day) Tea ( 2-4 a day ).   Sleep habits are as follows: The patient's dinner time is between 8-9 PM. The patient goes to bed at 10 PM and continues to sleep for 5-6 hours, wakes for 1 bathroom break. Left foot pain- .   The preferred sleep position is sideways, with the support of 1 pillow.  Dreams are reportedly rare.  4.30  AM is  the usual rise time. The patient wakes up with an alarm.  He reports not feeling refreshed or restored in AM, with symptoms such as dry mouth, , and residual fatigue. Naps are taken infrequently, since he is now out of work - lasting from 20 to 60 minutes, very refreshing,     REVIEW OF SYSTEMS: Out of a complete 14 system review of symptoms, the patient complains only of the following symptoms, and all other reviewed systems are negative.   ALLERGIES: No Known Allergies   HOME MEDICATIONS: Outpatient Medications Prior to Visit  Medication Sig Dispense Refill   allopurinol (ZYLOPRIM) 300 MG tablet Take 300 mg by mouth daily.     amLODipine (NORVASC) 10 MG tablet Take 10 mg by mouth daily.     Cholecalciferol 1.25 MG (50000 UT) WAFR Take by mouth once a week.     lisinopril (ZESTRIL) 30 MG tablet Take 30 mg by mouth daily.     meloxicam (MOBIC) 15 MG tablet Take 15 mg by mouth daily as needed.     metFORMIN (GLUCOPHAGE) 1000 MG tablet Take 1,000 mg by mouth 2 (two) times daily with a meal.     pravastatin (PRAVACHOL) 20 MG tablet Take 20 mg by mouth daily.     No facility-administered medications prior to visit.     PAST MEDICAL HISTORY: Past Medical  History:  Diagnosis Date   Essential hypertension    Gout    Mixed hyperlipidemia    Multiple joint pain    Type 2 diabetes mellitus (HCC)    Type 2 diabetes mellitus (HCC)    Vitamin D deficiency      PAST SURGICAL HISTORY: Past Surgical History:  Procedure Laterality Date   KNEE ARTHROSCOPY     tumor removed     bladder     FAMILY HISTORY: Family History  Problem Relation Age of Onset   Lung cancer Mother    Diabetes Father    Hypertension Father    Kidney disease Sister      SOCIAL HISTORY: Social History   Socioeconomic History   Marital status: Married    Spouse name: Not on file   Number of children: 2   Years of education: Not on file   Highest education level: Not on file  Occupational History    Not on file  Tobacco Use   Smoking status: Former    Packs/day: 0.50    Types: Cigarettes    Quit date: 2022    Years since quitting: 1.4   Smokeless tobacco: Never  Vaping Use   Vaping Use: Never used  Substance and Sexual Activity   Alcohol use: Never   Drug use: Never   Sexual activity: Not on file  Other Topics Concern   Not on file  Social History Narrative   Caffiene" 1 cup daily. Soda's 1.5 bottles.  Working: not at this time, injury. College.  Married.2 kids.   Social Determinants of Health   Financial Resource Strain: Not on file  Food Insecurity: Not on file  Transportation Needs: Not on file  Physical Activity: Not on file  Stress: Not on file  Social Connections: Not on file  Intimate Partner Violence: Not on file     PHYSICAL EXAM  There were no vitals filed for this visit. There is no height or weight on file to calculate BMI.  Generalized: Well developed, in no acute distress  Cardiology: normal rate and rhythm, no murmur auscultated  Respiratory: clear to auscultation bilaterally    Neurological examination  Mentation: Alert oriented to time, place, history taking. Follows all commands speech and language fluent Cranial nerve II-XII: Pupils were equal round reactive to light. Extraocular movements were full, visual field were full on confrontational test. Facial sensation and strength were normal. Uvula tongue midline. Head turning and shoulder shrug  were normal and symmetric. Motor: The motor testing reveals 5 over 5 strength of all 4 extremities. Good symmetric motor tone is noted throughout.  Sensory: Sensory testing is intact to soft touch on all 4 extremities. No evidence of extinction is noted.  Coordination: Cerebellar testing reveals good finger-nose-finger and heel-to-shin bilaterally.  Gait and station: Gait is normal. Tandem gait is normal. Romberg is negative. No drift is seen.  Reflexes: Deep tendon reflexes are symmetric and normal  bilaterally.    DIAGNOSTIC DATA (LABS, IMAGING, TESTING) - I reviewed patient records, labs, notes, testing and imaging myself where available.  Lab Results  Component Value Date   HGB 14.6 04/25/2007   HCT 43.0 04/25/2007      Component Value Date/Time   NA 141 04/25/2007 1703   K 3.8 04/25/2007 1703   CL 106 04/25/2007 1703   GLUCOSE 89 04/25/2007 1703   BUN 8 04/25/2007 1703   No results found for: CHOL, HDL, LDLCALC, LDLDIRECT, TRIG, CHOLHDL No results found for: HGBA1C No results  found for: VITAMINB12 No results found for: TSH      View : No data to display.               View : No data to display.           ASSESSMENT AND PLAN  51 y.o. year old male  has a past medical history of Essential hypertension, Gout, Mixed hyperlipidemia, Multiple joint pain, Type 2 diabetes mellitus ([place]), Type 2 diabetes mellitus ([place]), and Vitamin D deficiency. here with    No diagnosis found.  [place] ***.  Healthy lifestyle habits encouraged. *** will follow up with PCP as directed. *** will return to see me in ***, sooner if needed. *** verbalizes understanding and agreement with this plan.   No orders of the defined types were placed in this encounter.    No orders of the defined types were placed in this encounter.    Debbora Presto, MSN, FNP-C 03/31/2022, 12:32 PM  [facility] [address], [place], [place] [address] [phone]

## 2022-05-17 NOTE — Progress Notes (Signed)
Erroneous encounter-disregard

## 2022-05-26 ENCOUNTER — Encounter: Payer: Self-pay | Admitting: Family

## 2022-05-26 DIAGNOSIS — Z7689 Persons encountering health services in other specified circumstances: Secondary | ICD-10-CM

## 2022-06-15 ENCOUNTER — Encounter: Payer: Self-pay | Admitting: Physician Assistant

## 2022-06-15 ENCOUNTER — Other Ambulatory Visit: Payer: Self-pay

## 2022-06-15 ENCOUNTER — Ambulatory Visit
Admission: EM | Admit: 2022-06-15 | Discharge: 2022-06-15 | Disposition: A | Discharge status: Home / Self Care | Payer: BC Managed Care – PPO

## 2022-06-15 DIAGNOSIS — I10 Essential (primary) hypertension: Secondary | ICD-10-CM | POA: Diagnosis not present

## 2022-06-15 NOTE — ED Triage Notes (Signed)
Pt here for htn and just started on new medication; pt here for continued high blood pressure readings at time

## 2022-06-15 NOTE — Discharge Instructions (Signed)
  BP in office: 138/91

## 2022-06-15 NOTE — ED Provider Notes (Signed)
[facility]    CSN: [id number] Arrival date & time: 06/15/22  1619      History   Chief Complaint Chief Complaint  Patient presents with   Hypertension    HPI BRENON ANTOSH is a 51 y.o. male.   Patient here today for blood pressure check.  He reports that he has no hypertension and was started on a medication and needed blood pressure checked in order to return to work.  He denies any chest pain or shortness of breath.  He has not had any headaches, numbness or weakness.  The history is provided by the patient.    Past Medical History:  Diagnosis Date   Essential hypertension    Gout    Mixed hyperlipidemia    Multiple joint pain    Type 2 diabetes mellitus (HCC)    Type 2 diabetes mellitus (HCC)    Vitamin D deficiency     Patient Active Problem List   Diagnosis Date Noted   Snoring 11/20/2021   Hypersomnia with sleep apnea 11/20/2021   Shifting sleep-work schedule 11/20/2021    Past Surgical History:  Procedure Laterality Date   KNEE ARTHROSCOPY     tumor removed     bladder       Home Medications    Prior to Admission medications   Medication Sig Start Date End Date Taking? Authorizing Provider  allopurinol (ZYLOPRIM) 300 MG tablet Take 300 mg by mouth daily.    [provider]  amLODipine (NORVASC) 10 MG tablet Take 10 mg by mouth daily.    [provider]  Cholecalciferol 1.25 MG (50000 UT) WAFR Take by mouth once a week.    [provider]  lisinopril (ZESTRIL) 30 MG tablet Take 30 mg by mouth daily.    [provider]  meloxicam (MOBIC) 15 MG tablet Take 15 mg by mouth daily as needed.    [provider]  metFORMIN (GLUCOPHAGE) 1000 MG tablet Take 1,000 mg by mouth 2 (two) times daily with a meal. 06/10/19   [provider]  pravastatin (PRAVACHOL) 20 MG tablet Take 20 mg by mouth daily.    [provider]    Family History Family History  Problem Relation Age of  Onset   Lung cancer Mother    Diabetes Father    Hypertension Father    Kidney disease Sister     Social History Social History   Tobacco Use   Smoking status: Former    Packs/day: 0.50    Types: Cigarettes    Quit date: 2022    Years since quitting: 1.6   Smokeless tobacco: Never  Vaping Use   Vaping Use: Never used  Substance Use Topics   Alcohol use: Never   Drug use: Never     Allergies   Patient has no known allergies.   Review of Systems Review of Systems  Constitutional:  Negative for chills and fever.  Eyes:  Negative for discharge and redness.  Respiratory:  Negative for shortness of breath.   Cardiovascular:  Negative for chest pain.  Gastrointestinal:  Negative for nausea and vomiting.  Neurological:  Negative for weakness, numbness and headaches.     Physical Exam Triage Vital Signs ED Triage Vitals  Enc Vitals Group     BP      Pulse      Resp      Temp      Temp src      SpO2  Weight      Height      Head Circumference      Peak Flow      Pain Score      Pain Loc      Pain Edu?      Excl. in GC?    No data found.  Updated Vital Signs BP (!) 138/91 (BP Location: Left Arm)   Pulse 98   Temp 98.2 F (36.8 C) (Oral)   Resp 18   SpO2 97%      Physical Exam Vitals and nursing note reviewed.  Constitutional:      General: He is not in acute distress.    Appearance: Normal appearance. He is not ill-appearing.  HENT:     Head: Normocephalic and atraumatic.  Eyes:     Conjunctiva/sclera: Conjunctivae normal.  Cardiovascular:     Rate and Rhythm: Normal rate.  Pulmonary:     Effort: Pulmonary effort is normal.  Neurological:     Mental Status: He is alert.  Psychiatric:        Mood and Affect: Mood normal.        Behavior: Behavior normal.        Thought Content: Thought content normal.      UC Treatments / Results  Labs (all labs ordered are listed, but only abnormal results are displayed) Labs Reviewed - No data  to display  EKG   Radiology No results found.  Procedures Procedures (including critical care time)  Medications Ordered in UC Medications - No data to display  Initial Impression / Assessment and Plan / UC Course  I have reviewed the triage vital signs and the nursing notes.  Pertinent labs & imaging results that were available during my care of the patient were reviewed by me and considered in my medical decision making (see chart for details).    Patient's blood pressure relatively normal in office. Seemingly ok to return to work.  Recommend follow-up with PCP as planned and here any further concerns.  Final Clinical Impressions(s) / UC Diagnoses   Final diagnoses:  Essential hypertension     Discharge Instructions       BP in office: 138/91     ED Prescriptions   None    PDMP not reviewed this encounter.   Tomi Bamberger, PA-C 06/15/22 1707

## 2022-07-04 NOTE — Progress Notes (Deleted)
  Subjective:    Glen Cuevas - 51 y.o. male MRN [id number]  Date of birth: 1971/06/18  HPI  Glen Cuevas is to establish care and urgent care follow-up.   Current issues and/or concerns: 06/15/2022 [facility] per PA note:  Patient's blood pressure relatively normal in office. Seemingly ok to return to work.  Recommend follow-up with PCP as planned and here any further concerns.  Today's visit 07/12/2022: Which bp meds taking?  ROS per HPI     Health Maintenance:  Health Maintenance Due  Topic Date Due   COVID-19 Vaccine (1) Never done   HIV Screening  Never done   Hepatitis C Screening  Never done   TETANUS/TDAP  Never done   COLONOSCOPY (Pts 45-21yrs Insurance coverage will need to be confirmed)  Never done   Zoster Vaccines- Shingrix (1 of 2) Never done   INFLUENZA VACCINE  Never done     Past Medical History: Patient Active Problem List   Diagnosis Date Noted   Snoring 11/20/2021   Hypersomnia with sleep apnea 11/20/2021   Shifting sleep-work schedule 11/20/2021      Social History   reports that he quit smoking about 20 months ago. His smoking use included cigarettes. He smoked an average of .5 packs per day. He has never used smokeless tobacco. He reports that he does not drink alcohol and does not use drugs.   Family History  family history includes Diabetes in his father; Hypertension in his father; Kidney disease in his sister; Lung cancer in his mother.   Medications: reviewed and updated   Objective:   Physical Exam There were no vitals taken for this visit. Physical Exam      Assessment & Plan:         Patient was given clear instructions to go to Emergency Department or return to medical center if symptoms don't improve, worsen, or new problems develop.The patient verbalized understanding.  I discussed the assessment and treatment plan with the patient. The patient was provided an opportunity to ask  questions and all were answered. The patient agreed with the plan and demonstrated an understanding of the instructions.   The patient was advised to call back or seek an in-person evaluation if the symptoms worsen or if the condition fails to improve as anticipated.    Glen Stabs, NP 07/04/2022, 4:43 PM Primary Care at [facility]

## 2022-07-12 ENCOUNTER — Ambulatory Visit: Payer: Self-pay | Admitting: Family

## 2022-07-12 DIAGNOSIS — Z7689 Persons encountering health services in other specified circumstances: Secondary | ICD-10-CM

## 2022-07-12 DIAGNOSIS — I10 Essential (primary) hypertension: Secondary | ICD-10-CM

## 2022-09-27 NOTE — Progress Notes (Deleted)
**Note Glen-Identified via Obfuscation**   Subjective:    Glen Cuevas - 51 y.o. male MRN [id number]  Date of birth: [birth year]  HPI  Glen Cuevas is to establish care.  Current issues and/or concerns: Estab with Neuro - snoring, hypersomnia  HTN - Amlodipine, Lisinopril DM - Metformin HLD - Pravastatin    ROS per HPI     Health Maintenance:  Health Maintenance Due  Topic Date Due   COVID-19 Vaccine (1) Never done   HIV Screening  Never done   Hepatitis C Screening  Never done   COLONOSCOPY (Pts 45-89yrs Insurance coverage will need to be confirmed)  Never done   Zoster Vaccines- Shingrix (1 of 2) Never done   INFLUENZA VACCINE  Never done     Past Medical History: Patient Active Problem List   Diagnosis Date Noted   Snoring 11/20/2021   Hypersomnia with sleep apnea 11/20/2021   Shifting sleep-work schedule 11/20/2021      Social History   reports that he quit smoking about 22 months ago. His smoking use included cigarettes. He smoked an average of .5 packs per day. He has never used smokeless tobacco. He reports that he does not drink alcohol and does not use drugs.   Family History  family history includes Diabetes in his father; Hypertension in his father; Kidney disease in his sister; Lung cancer in his mother.   Medications: reviewed and updated   Objective:   Physical Exam There were no vitals taken for this visit. Physical Exam      Assessment & Plan:         Patient was given clear instructions to go to Emergency Department or return to medical center if symptoms don't improve, worsen, or new problems develop.The patient verbalized understanding.  I discussed the assessment and treatment plan with the patient. The patient was provided an opportunity to ask questions and all were answered. The patient agreed with the plan and demonstrated an understanding of the instructions.   The patient was advised to call back or seek an in-person evaluation if the symptoms worsen  or if the condition fails to improve as anticipated.    Glen Stabs, NP 09/27/2022, 8:34 AM Primary Care at [facility]

## 2022-09-29 ENCOUNTER — Ambulatory Visit: Payer: Self-pay | Admitting: Family

## 2022-09-29 DIAGNOSIS — Z7689 Persons encountering health services in other specified circumstances: Secondary | ICD-10-CM

## 2022-10-12 ENCOUNTER — Ambulatory Visit (INDEPENDENT_AMBULATORY_CARE_PROVIDER_SITE_OTHER): Payer: BC Managed Care – PPO

## 2022-10-12 ENCOUNTER — Ambulatory Visit: Payer: BC Managed Care – PPO

## 2022-10-12 ENCOUNTER — Ambulatory Visit
Admission: RE | Admit: 2022-10-12 | Discharge: 2022-10-12 | Disposition: A | Discharge status: Home / Self Care | Payer: BC Managed Care – PPO | Source: Ambulatory Visit

## 2022-10-12 VITALS — BP 135/86 | HR 84 | Temp 98.6°F | Resp 20

## 2022-10-12 DIAGNOSIS — R0782 Intercostal pain: Secondary | ICD-10-CM | POA: Diagnosis not present

## 2022-10-12 DIAGNOSIS — S20212A Contusion of left front wall of thorax, initial encounter: Secondary | ICD-10-CM | POA: Diagnosis not present

## 2022-10-12 DIAGNOSIS — R0781 Pleurodynia: Secondary | ICD-10-CM | POA: Diagnosis not present

## 2022-10-12 MED ORDER — NAPROXEN 500 MG PO TABS: 500.0000 mg | ORAL_TABLET | Freq: Two times a day (BID) | ORAL | 0 refills | Status: AC

## 2022-10-12 MED ORDER — PREDNISONE 10 MG PO TABS: 10.0000 mg | ORAL_TABLET | Freq: Three times a day (TID) | ORAL | 0 refills | Status: DC

## 2022-10-12 NOTE — Discharge Instructions (Addendum)
Advised take the prednisone 10 mg every 8 hours until completed to reduce inflammation. Advised take the Naprosyn 500 mg every 12 hours with food to help reduce pain. Advised to follow-up with PCP or return to urgent care if symptoms fail to improve.  (Consider orthopedic consult)
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
## 2022-11-11 ENCOUNTER — Ambulatory Visit
Admission: EM | Admit: 2022-11-11 | Discharge: 2022-11-11 | Disposition: A | Discharge status: Home / Self Care | Payer: BC Managed Care – PPO

## 2022-11-11 DIAGNOSIS — M79675 Pain in left toe(s): Secondary | ICD-10-CM | POA: Diagnosis not present

## 2022-11-11 MED ORDER — PREDNISONE 20 MG PO TABS: 40.0000 mg | ORAL_TABLET | Freq: Every day | ORAL | 0 refills | Status: AC

## 2022-11-11 MED ORDER — DOXYCYCLINE HYCLATE 100 MG PO CAPS: 100.0000 mg | ORAL_CAPSULE | Freq: Two times a day (BID) | ORAL | 0 refills | Status: AC

## 2022-11-11 NOTE — ED Provider Notes (Signed)
[facility]    CSN: [id number] Arrival date & time: 11/11/22  9833      History   Chief Complaint Chief Complaint  Patient presents with   Toe Pain    HPI Glen Cuevas is a 52 y.o. male.   Patient here today for evaluation of pain to his left great toe around the lateral nail that has been present for 2 weeks. He reports he has not been able to see podiatrist. He does not report injury. HE states that typically his wife would trim his toenails but she is not "here". He denies any numbness or tingling. He reports walking in his steel toed work boots makes pain worse.   The history is provided by the patient.  Toe Pain    Past Medical History:  Diagnosis Date   Essential hypertension    Gout    Mixed hyperlipidemia    Multiple joint pain    Type 2 diabetes mellitus ([place])    Type 2 diabetes mellitus ([place])    Vitamin D deficiency     Patient Active Problem List   Diagnosis Date Noted   Snoring 11/20/2021   Hypersomnia with sleep apnea 11/20/2021   Shifting sleep-work schedule 11/20/2021    Past Surgical History:  Procedure Laterality Date   KNEE ARTHROSCOPY     tumor removed     bladder       Home Medications    Prior to Admission medications   Medication Sig Start Date End Date Taking? Authorizing Provider  doxycycline (VIBRAMYCIN) 100 MG capsule Take 1 capsule (100 mg total) by mouth 2 (two) times daily. 11/11/22  Yes Francene Finders, PA-C  predniSONE (DELTASONE) 20 MG tablet Take 2 tablets (40 mg total) by mouth daily with breakfast for 5 days. 11/11/22 11/16/22 Yes Francene Finders, PA-C  TRULICITY 8.25 KN/3.9JQ SOPN Inject 0.75 mg into the skin once a week. 10/05/22  Yes [provider]  allopurinol (ZYLOPRIM) 300 MG tablet Take 300 mg by mouth daily.    [provider]  amLODipine (NORVASC) 10 MG tablet Take 10 mg by mouth daily.    [provider]  Cholecalciferol 1.25 MG (50000 UT) WAFR Take by mouth once a  week.    [provider]  lisinopril (ZESTRIL) 30 MG tablet Take 30 mg by mouth daily.    [provider]  meloxicam (MOBIC) 15 MG tablet Take 15 mg by mouth daily as needed.    [provider]  metFORMIN (GLUCOPHAGE) 1000 MG tablet Take 500 mg by mouth 2 (two) times daily with a meal. 06/10/19   [provider]  naproxen (NAPROSYN) 500 MG tablet Take 1 tablet (500 mg total) by mouth 2 (two) times daily. 10/12/22   Nyoka Lint, PA-C  pravastatin (PRAVACHOL) 20 MG tablet Take 20 mg by mouth daily.    [provider]  valsartan (DIOVAN) 320 MG tablet Take 320 mg by mouth daily.    [provider]    Family History Family History  Problem Relation Age of Onset   Lung cancer Mother    Diabetes Father    Hypertension Father    Kidney disease Sister     Social History Social History   Tobacco Use   Smoking status: Some Days    Packs/day: 0.50    Types: Cigarettes   Smokeless tobacco: Never  Vaping Use   Vaping Use: Never used  Substance Use Topics   Alcohol use: Never  Drug use: Never     Allergies   Patient has no known allergies.   Review of Systems Review of Systems  Constitutional:  Negative for chills and fever.  Eyes:  Negative for discharge and redness.  Skin:  Negative for color change and wound.  Neurological:  Negative for numbness.     Physical Exam Triage Vital Signs ED Triage Vitals  Enc Vitals Group     BP 11/11/22 0916 134/87     Pulse Rate 11/11/22 0916 85     Resp 11/11/22 0916 19     Temp 11/11/22 0916 97.8 F (36.6 C)     Temp src --      SpO2 11/11/22 0916 98 %     Weight --      Height --      Head Circumference --      Peak Flow --      Pain Score 11/11/22 0915 7     Pain Loc --      Pain Edu? --      Excl. in GC? --    No data found.  Updated Vital Signs BP 134/87   Pulse 85   Temp 97.8 F (36.6 C)   Resp 19   SpO2 98%       Physical Exam Vitals and nursing note  reviewed.  Constitutional:      General: He is not in acute distress.    Appearance: Normal appearance. He is not ill-appearing.  HENT:     Head: Normocephalic and atraumatic.  Eyes:     Conjunctiva/sclera: Conjunctivae normal.  Cardiovascular:     Rate and Rhythm: Normal rate.  Pulmonary:     Effort: Pulmonary effort is normal. No respiratory distress.  Musculoskeletal:     Comments: Minimal swelling appreciated to left great to distally without erythema, drainage  Neurological:     Mental Status: He is alert.  Psychiatric:        Mood and Affect: Mood normal.        Behavior: Behavior normal.        Thought Content: Thought content normal.      UC Treatments / Results  Labs (all labs ordered are listed, but only abnormal results are displayed) Labs Reviewed - No data to display  EKG   Radiology No results found.  Procedures Procedures (including critical care time)  Medications Ordered in UC Medications - No data to display  Initial Impression / Assessment and Plan / UC Course  I have reviewed the triage vital signs and the nursing notes.  Pertinent labs & imaging results that were available during my care of the patient were reviewed by me and considered in my medical decision making (see chart for details).    Possible early ingrown toenail- will trial treatment with steroid burst and antibiotic. Encourage follow up with PCP/ podiatrist if no gradual improvement or with any further concerns.   Final Clinical Impressions(s) / UC Diagnoses   Final diagnoses:  Great toe pain, left   Discharge Instructions   None    ED Prescriptions     Medication Sig Dispense Auth. Provider   predniSONE (DELTASONE) 20 MG tablet Take 2 tablets (40 mg total) by mouth daily with breakfast for 5 days. 10 tablet Erma Pinto F, PA-C   doxycycline (VIBRAMYCIN) 100 MG capsule Take 1 capsule (100 mg total) by mouth 2 (two) times daily. 20 capsule Tomi Bamberger, PA-C       PDMP not reviewed  this encounter.   Francene Finders, PA-C 11/11/22 1510

## 2022-11-11 NOTE — ED Triage Notes (Signed)
Pt presents to [facility] with co of L big toe nail pain for 2 weeks pt reports unable to get into the podiatrist. Pt reports pain is worse when walking and using his steel toe boots at work.

## 2023-01-05 ENCOUNTER — Ambulatory Visit: Payer: BC Managed Care – PPO | Admitting: Family Medicine

## 2023-04-14 DIAGNOSIS — N529 Male erectile dysfunction, unspecified: Secondary | ICD-10-CM | POA: Insufficient documentation

## 2023-05-18 ENCOUNTER — Ambulatory Visit
Admission: EM | Admit: 2023-05-18 | Discharge: 2023-05-18 | Disposition: A | Discharge status: Home / Self Care | Payer: BC Managed Care – PPO | Attending: Internal Medicine | Admitting: Internal Medicine

## 2023-05-18 DIAGNOSIS — R112 Nausea with vomiting, unspecified: Secondary | ICD-10-CM

## 2023-05-18 DIAGNOSIS — R739 Hyperglycemia, unspecified: Secondary | ICD-10-CM | POA: Diagnosis not present

## 2023-05-18 DIAGNOSIS — R5383 Other fatigue: Secondary | ICD-10-CM

## 2023-05-18 LAB — POCT URINALYSIS DIP (MANUAL ENTRY)
Bilirubin, UA: NEGATIVE
Blood, UA: NEGATIVE
Glucose, UA: 250 mg/dL — AB
Ketones, POC UA: NEGATIVE mg/dL
Leukocytes, UA: NEGATIVE
Nitrite, UA: NEGATIVE
Protein Ur, POC: 100 mg/dL — AB
Spec Grav, UA: >=1.03 — AB (ref 1.010–1.025)
Urobilinogen, UA: 0.2 E.U./dL
pH, UA: 5.5 (ref 5.0–8.0)

## 2023-05-18 LAB — POCT FASTING CBG KUC MANUAL ENTRY: POCT Glucose (KUC): 218 mg/dL — AB (ref 70–99)

## 2023-05-18 MED ORDER — SODIUM CHLORIDE 0.9 % IV BOLUS
1000.0000 mL | Freq: Once | INTRAVENOUS | Status: AC
  Administered 2023-05-18: 1000 mL via INTRAVENOUS

## 2023-05-18 NOTE — ED Triage Notes (Signed)
Pt reports after getting his blood sugar and vitals checked at work they told him he could not come back until it is "under control" earlier today.   States he got really hot and became fatigue and nauseas. Needs clearance for work.   Pt states he is on diabetes meds

## 2023-05-18 NOTE — Discharge Instructions (Signed)
Please follow-up with PCP tomorrow.  Monitor blood glucose closely.  Ensure that you are drinking plenty of water and eating a low carbohydrate diet.  Go to the emergency department if symptoms persist or worsen.

## 2023-05-18 NOTE — ED Provider Notes (Signed)
[facility]    CSN: [id number] Arrival date & time: 05/18/23  1704      History   Chief Complaint Chief Complaint  Patient presents with   Fatigue   Emesis    HPI Glen Cuevas is a 52 y.o. male.   Patient presents today given concern of "heat exhaustion".  Patient states that he had an episode today at work where he became very hot and nauseous which caused subsequent vomiting with nonbloody emesis.  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  He had been drinking water today but did not eat very well.  Reports that he went to health at work and blood sugar was in the 300s.  He was told that he had to be seen in urgent care today to receive clearance to return to work.  He denies chest pain, shortness of breath, headache, dizziness, nausea, vomiting at this time.  Reports that symptoms are resolved but he is left with fatigue.  Patient states that he does not routinely check his blood sugar at home but it ranges from anywhere from 130s to 300s.  States that he currently takes Ozempic and glipizide which was recently changed a few months prior from metformin and Trulicity.   Emesis   Past Medical History:  Diagnosis Date   Essential hypertension    Gout    Mixed hyperlipidemia    Multiple joint pain    Type 2 diabetes mellitus ([facility])    Type 2 diabetes mellitus ([facility])    Vitamin D deficiency     Patient Active Problem List   Diagnosis Date Noted   Snoring 11/20/2021   Hypersomnia with sleep apnea 11/20/2021   Shifting sleep-work schedule 11/20/2021    Past Surgical History:  Procedure Laterality Date   KNEE ARTHROSCOPY     tumor removed     bladder       Home Medications    Prior to Admission medications   Medication Sig Start Date End Date Taking? Authorizing Provider  allopurinol (ZYLOPRIM) 300 MG tablet Take 300 mg by mouth daily.    [provider]  amLODipine (NORVASC) 10 MG tablet Take 10 mg by mouth daily.     [provider]  Cholecalciferol 1.25 MG (50000 UT) WAFR Take by mouth once a week.    [provider]  doxycycline (VIBRAMYCIN) 100 MG capsule Take 1 capsule (100 mg total) by mouth 2 (two) times daily. 11/11/22   Tomi Bamberger, PA-C  lisinopril (ZESTRIL) 30 MG tablet Take 30 mg by mouth daily.    [provider]  meloxicam (MOBIC) 15 MG tablet Take 15 mg by mouth daily as needed.    [provider]  metFORMIN (GLUCOPHAGE) 1000 MG tablet Take 500 mg by mouth 2 (two) times daily with a meal. 06/10/19   [provider]  naproxen (NAPROSYN) 500 MG tablet Take 1 tablet (500 mg total) by mouth 2 (two) times daily. 10/12/22   Ellsworth Lennox, PA-C  pravastatin (PRAVACHOL) 20 MG tablet Take 20 mg by mouth daily.    [provider]  TRULICITY 0.75 MG/0.5ML SOPN Inject 0.75 mg into the skin once a week. 10/05/22   [provider]  valsartan (DIOVAN) 320 MG tablet Take 320 mg by mouth daily.    [provider]    Family History Family History  Problem Relation Age of Onset   Lung cancer Mother    Diabetes Father    Hypertension  Father    Kidney disease Sister     Social History Social History   Tobacco Use   Smoking status: Some Days    Current packs/day: 0.50    Types: Cigarettes   Smokeless tobacco: Never  Vaping Use   Vaping status: Never Used  Substance Use Topics   Alcohol use: Never   Drug use: Never     Allergies   Patient has no known allergies.   Review of Systems Review of Systems Per HPI  Physical Exam Triage Vital Signs ED Triage Vitals  Encounter Vitals Group     BP 05/18/23 1711 106/71     Systolic BP Percentile --      Diastolic BP Percentile --      Pulse Rate 05/18/23 1711 (!) 114     Resp 05/18/23 1711 20     Temp 05/18/23 1711 98.6 F (37 C)     Temp Source 05/18/23 1711 Oral     SpO2 05/18/23 1711 97 %     Weight --      Height --      Head Circumference --      Peak Flow --       Pain Score 05/18/23 1712 0     Pain Loc --      Pain Education --      Exclude from Growth Chart --    No data found.  Updated Vital Signs BP 123/77   Pulse 84   Temp 98.6 F (37 C) (Oral)   Resp 16   SpO2 96%   Visual Acuity Right Eye Distance:   Left Eye Distance:   Bilateral Distance:    Right Eye Near:   Left Eye Near:    Bilateral Near:     Physical Exam Constitutional:      General: He is not in acute distress.    Appearance: Normal appearance. He is not toxic-appearing or diaphoretic.  HENT:     Head: Normocephalic and atraumatic.  Eyes:     Extraocular Movements: Extraocular movements intact.     Conjunctiva/sclera: Conjunctivae normal.     Pupils: Pupils are equal, round, and reactive to light.  Cardiovascular:     Rate and Rhythm: Regular rhythm. Tachycardia present.     Pulses: Normal pulses.     Heart sounds: Normal heart sounds.  Pulmonary:     Effort: Pulmonary effort is normal. No respiratory distress.     Breath sounds: Normal breath sounds. No stridor. No wheezing, rhonchi or rales.  Neurological:     General: No focal deficit present.     Mental Status: He is alert and oriented to person, place, and time. Mental status is at baseline.     Cranial Nerves: Cranial nerves 2-12 are intact.     Sensory: Sensation is intact.     Motor: Motor function is intact.     Coordination: Coordination is intact.     Gait: Gait is intact.  Psychiatric:        Mood and Affect: Mood normal.        Behavior: Behavior normal.        Thought Content: Thought content normal.        Judgment: Judgment normal.      UC Treatments / Results  Labs (all labs ordered are listed, but only abnormal results are displayed) Labs Reviewed  POCT FASTING CBG [facility] MANUAL ENTRY - Abnormal; Notable for the following components:      Result Value  POCT Glucose ([facility]) 218 (*)    All other components within normal limits  POCT URINALYSIS DIP (MANUAL ENTRY) - Abnormal;  Notable for the following components:   Color, UA straw (*)    Glucose, UA =250 (*)    Spec Grav, UA >=1.030 (*)    Protein Ur, POC =100 (*)    All other components within normal limits  CBC  COMPREHENSIVE METABOLIC PANEL    EKG   Radiology No results found.  Procedures Procedures (including critical care time)  Medications Ordered in UC Medications  sodium chloride 0.9 % bolus 1,000 mL (0 mLs Intravenous Stopped 05/18/23 1907)    Initial Impression / Assessment and Plan / UC Course  I have reviewed the triage vital signs and the nursing notes.  Pertinent labs & imaging results that were available during my care of the patient were reviewed by me and considered in my medical decision making (see chart for details).     Given concern of heat related symptoms, recommended to patient that he go to the emergency department today to have a full and adequate workup.  He declined going to the ER.  Risks associated with not going to the ER were discussed with patient.  Patient voiced understanding and accepted risks.  Will do limited workup and treatment here in urgent care.  EKG completed given tachycardia noted which was unremarkable for any acute abnormality.  UA unremarkable as well.  IV fluid bolus administered in urgent care today with vital signs staying stable.  Patient states that he felt much better.  Will obtain CMP and CBC.  Awaiting results.  Patient reports that he is following up with PCP tomorrow as well.  Also advised strict ER precautions.  Advised patient that I am not able to clear patient to return to work and that PCP will have to do this.  Advised to monitor blood glucose very closely at home as well.  Blood glucose 218 today.  Patient verbalized understanding and was agreeable with plan. Final Clinical Impressions(s) / UC Diagnoses   Final diagnoses:  Other fatigue  Hyperglycemia  Nausea and vomiting, unspecified vomiting type     Discharge Instructions       Please follow-up with PCP tomorrow.  Monitor blood glucose closely.  Ensure that you are drinking plenty of water and eating a low carbohydrate diet.  Go to the emergency department if symptoms persist or worsen.     ED Prescriptions   None    PDMP not reviewed this encounter.   Gustavus Bryant, [place] 05/18/23 [phone]

## 2023-05-20 DIAGNOSIS — R110 Nausea: Secondary | ICD-10-CM | POA: Insufficient documentation

## 2023-05-20 LAB — CBC
Hematocrit: 38.5 % (ref 37.5–51.0)
Hemoglobin: 12.4 g/dL — ABNORMAL LOW (ref 13.0–17.7)
MCH: 27.7 pg (ref 26.6–33.0)
MCHC: 32.2 g/dL (ref 31.5–35.7)
MCV: 86 fL (ref 79–97)
Platelets: 313 10*3/uL (ref 150–450)
RBC: 4.47 x10E6/uL (ref 4.14–5.80)
RDW: 13.2 % (ref 11.6–15.4)
WBC: 10.7 10*3/uL (ref 3.4–10.8)

## 2023-05-20 LAB — COMPREHENSIVE METABOLIC PANEL
ALT: 33 IU/L (ref 0–44)
AST: 29 IU/L (ref 0–40)
Albumin: 4.3 g/dL (ref 3.8–4.9)
Alkaline Phosphatase: 105 IU/L (ref 44–121)
BUN/Creatinine Ratio: 12 (ref 9–20)
BUN: 17 mg/dL (ref 6–24)
Bilirubin Total: 0.4 mg/dL (ref 0.0–1.2)
CO2: 22 mmol/L (ref 20–29)
Calcium: 9.4 mg/dL (ref 8.7–10.2)
Chloride: 98 mmol/L (ref 96–106)
Creatinine, Ser: 1.43 mg/dL — ABNORMAL HIGH (ref 0.76–1.27)
Globulin, Total: 3 g/dL (ref 1.5–4.5)
Glucose: 138 mg/dL — ABNORMAL HIGH (ref 70–99)
Potassium: 4.4 mmol/L (ref 3.5–5.2)
Sodium: 138 mmol/L (ref 134–144)
Total Protein: 7.3 g/dL (ref 6.0–8.5)
eGFR: 59 mL/min/{1.73_m2} — ABNORMAL LOW (ref >59)

## 2023-06-28 DIAGNOSIS — R059 Cough, unspecified: Secondary | ICD-10-CM | POA: Insufficient documentation

## 2023-08-18 DIAGNOSIS — R7989 Other specified abnormal findings of blood chemistry: Secondary | ICD-10-CM | POA: Insufficient documentation

## 2023-08-26 ENCOUNTER — Ambulatory Visit (INDEPENDENT_AMBULATORY_CARE_PROVIDER_SITE_OTHER): Payer: BC Managed Care – PPO

## 2023-08-30 LAB — COLOGUARD

## 2023-08-30 LAB — EXTERNAL GENERIC LAB PROCEDURE

## 2023-09-16 LAB — EXTERNAL GENERIC LAB PROCEDURE: COLOGUARD: NEGATIVE

## 2023-09-16 LAB — COLOGUARD: COLOGUARD: NEGATIVE

## 2024-01-12 ENCOUNTER — Ambulatory Visit (INDEPENDENT_AMBULATORY_CARE_PROVIDER_SITE_OTHER)

## 2024-01-12 ENCOUNTER — Ambulatory Visit: Admission: EM | Admit: 2024-01-12 | Discharge: 2024-01-12 | Disposition: A | Discharge status: Home / Self Care

## 2024-01-12 ENCOUNTER — Encounter: Payer: Self-pay | Admitting: Emergency Medicine

## 2024-01-12 DIAGNOSIS — R739 Hyperglycemia, unspecified: Secondary | ICD-10-CM | POA: Insufficient documentation

## 2024-01-12 DIAGNOSIS — E109 Type 1 diabetes mellitus without complications: Secondary | ICD-10-CM | POA: Insufficient documentation

## 2024-01-12 DIAGNOSIS — E559 Vitamin D deficiency, unspecified: Secondary | ICD-10-CM | POA: Insufficient documentation

## 2024-01-12 DIAGNOSIS — R0781 Pleurodynia: Secondary | ICD-10-CM

## 2024-01-12 DIAGNOSIS — Z9181 History of falling: Secondary | ICD-10-CM | POA: Insufficient documentation

## 2024-01-12 DIAGNOSIS — I10 Essential (primary) hypertension: Secondary | ICD-10-CM | POA: Insufficient documentation

## 2024-01-12 DIAGNOSIS — M109 Gout, unspecified: Secondary | ICD-10-CM | POA: Insufficient documentation

## 2024-01-12 DIAGNOSIS — E782 Mixed hyperlipidemia: Secondary | ICD-10-CM | POA: Insufficient documentation

## 2024-01-12 DIAGNOSIS — R42 Dizziness and giddiness: Secondary | ICD-10-CM | POA: Insufficient documentation

## 2024-01-12 NOTE — ED Provider Notes (Signed)
 [facility]    CSN: [id number] Arrival date & time: 01/12/24  0816      History   Chief Complaint Chief Complaint  Patient presents with   Abdominal Pain    HPI Glen Cuevas is a 53 y.o. male.   Patient presents today given rib pain to bilateral rib cage that started yesterday.  Reports this seemed to start after he was exposed to a chemical substance at work.  He states that he works for the [employer] in [place].  Patient originally stated that they used "ricin" to bond the tires, and this is what he was exposed to in the air. He thinks he may have inhaled it.  Although, he is not sure the exact name of the substance.  Reports that he took meloxicam prior to pain starting but has used this before and never had a similar reaction.  He reports he does have a little bit of cough as well.  Denies nausea, vomiting, headache, dizziness, diarrhea, constipation.  Last bowel movement was yesterday.  He does smoke cigarettes.  Denies any shortness of breath.  Denies history of asthma or COPD.  Blood pressure is elevated but he reports he has not taken his blood pressure medication today.  Patient is not reporting any chest pain, shortness of breath, headache, dizziness, blurry vision. Patient states he is not able to call his employer to inquire about the actual name of the substance he may have been exposed to. He states they do not wear personal protective equipment of any kind.    Abdominal Pain   Past Medical History:  Diagnosis Date   Essential hypertension    Gout    Mixed hyperlipidemia    Multiple joint pain    Type 2 diabetes mellitus (HCC)    Type 2 diabetes mellitus (HCC)    Vitamin D deficiency     Patient Active Problem List   Diagnosis Date Noted   At risk for falls 01/12/2024   Essential hypertension 01/12/2024   Type 1 diabetes mellitus without complications (HCC) 01/12/2024   Gout 01/12/2024   Gouty arthritis of right foot 01/12/2024    Hyperglycemia, unspecified 01/12/2024   Lightheaded 01/12/2024   Mixed hyperlipidemia 01/12/2024   Vitamin D deficiency 01/12/2024   Decreased testosterone level 08/18/2023   Cough 06/28/2023   Nausea 05/20/2023   Erectile dysfunction 04/14/2023   Snoring 11/20/2021   Hypersomnia with sleep apnea 11/20/2021   Shifting sleep-work schedule 11/20/2021   Multiple joint pain 07/08/2021   Type 2 diabetes mellitus without complication (HCC) 07/08/2021   Tobacco user 01/02/2021    Past Surgical History:  Procedure Laterality Date   KNEE ARTHROSCOPY     tumor removed     bladder       Home Medications    Prior to Admission medications   Medication Sig Start Date End Date Taking? Authorizing Provider  allopurinol (ZYLOPRIM) 300 MG tablet Take 300 mg by mouth daily.   Yes [provider]  amLODipine (NORVASC) 10 MG tablet Take 10 mg by mouth daily.   Yes [provider]  Cholecalciferol 1.25 MG (50000 UT) WAFR Take by mouth once a week.   Yes [provider]  glipiZIDE (GLUCOTROL XL) 10 MG 24 hr tablet Take 1 tablet twice a day by oral route for 90 days. 08/18/23  Yes [provider]  meloxicam (MOBIC) 15 MG tablet Take 15 mg by mouth daily as needed.   Yes [provider]  rosuvastatin (CRESTOR) 20 MG tablet Take 1 tablet every day by oral route in the evening for 90 days. 04/14/23  Yes [provider]  Semaglutide,0.25 or 0.5MG /DOS, (OZEMPIC, 0.25 OR 0.5 MG/DOSE,) 2 MG/3ML SOPN INJECT 0.5 MG SUBCUTANEOUSLY EVERY WEEK FOR 30 DAYS.   Yes [provider]  sildenafil (VIAGRA) 100 MG tablet TAKE ONE TABLET BY MOUTH ONCE DAILY AS NEEDED FOR ERECTILE DYSFUNCTION   Yes [provider]  valsartan (DIOVAN) 320 MG tablet Take 320 mg by mouth daily.   Yes [provider]  Azelastine HCl 137 MCG/SPRAY SOLN SPRAY 2 SPRAYS TWICE A DAY BY INTRANASAL ROUTE FOR 7 DAYS. Nasal for 30 Days Patient not taking: Reported on  01/12/2024    [provider]  doxycycline (VIBRAMYCIN) 100 MG capsule Take 1 capsule (100 mg total) by mouth 2 (two) times daily. Patient not taking: Reported on 01/12/2024 11/11/22   Tomi Bamberger, PA-C  hydrochlorothiazide (HYDRODIURIL) 12.5 MG tablet Take 1 tablet by mouth daily. Patient not taking: Reported on 01/12/2024    [provider]  lisinopril (ZESTRIL) 20 MG tablet 1 tablet Orally Daily Patient not taking: Reported on 01/12/2024    [provider]  lisinopril (ZESTRIL) 30 MG tablet Take 30 mg by mouth daily. Patient not taking: Reported on 01/12/2024    [provider]  metFORMIN (GLUCOPHAGE) 1000 MG tablet Take 500 mg by mouth 2 (two) times daily with a meal. Patient not taking: Reported on 01/12/2024 06/10/19   [provider]  naproxen (NAPROSYN) 500 MG tablet Take 1 tablet (500 mg total) by mouth 2 (two) times daily. Patient not taking: Reported on 01/12/2024 10/12/22   Ellsworth Lennox, PA-C  ondansetron (ZOFRAN-ODT) 4 MG disintegrating tablet PLACE 1 TABLET EVERY 6 HOURS BY TRANSLINGUAL ROUTE AS NEEDED FOR 10 DAYS, FOR NAUSEA. 05/20/23   [provider]  pravastatin (PRAVACHOL) 20 MG tablet Take 20 mg by mouth daily. Patient not taking: Reported on 01/12/2024    [provider]  Semaglutide, 1 MG/DOSE, (OZEMPIC, 1 MG/DOSE,) 4 MG/3ML SOPN INJECT 1 MG SUBCUTANEOUSLY EVERY WEEK FOR 28 DAYS.    [provider]  sildenafil (VIAGRA) 50 MG tablet Take 1 tablet by mouth daily as needed.    [provider]  tirzepatide Greggory Keen) 7.5 MG/0.5ML Pen Inject 7.5 mg every week by subcutaneous route for 30 days, for diabetes. Patient not taking: Reported on 01/12/2024 05/20/23   [provider]  TRULICITY 0.75 MG/0.5ML SOPN Inject 0.75 mg into the skin once a week. Patient not taking: Reported on 01/12/2024 10/05/22   [provider]  Vitamin D, Ergocalciferol, (DRISDOL) 1.25 MG (50000 UNIT) CAPS capsule Oral  for 28 Days    [provider]    Family History Family History  Problem Relation Age of Onset   Lung cancer Mother    Diabetes Father    Hypertension Father    Kidney disease Sister     Social History Social History   Tobacco Use   Smoking status: Some Days    Current packs/day: 0.50    Types: Cigarettes   Smokeless tobacco: Never  Vaping Use   Vaping status: Never Used  Substance Use Topics   Alcohol use: Never   Drug use: Never     Allergies   Bee venom   Review of Systems Review of Systems Per HPI  Physical Exam Triage Vital Signs ED Triage Vitals  Encounter Vitals Group     BP 01/12/24 0851 (!) 170/96  Systolic BP Percentile --      Diastolic BP Percentile --      Pulse Rate 01/12/24 0851 77     Resp 01/12/24 0851 18     Temp 01/12/24 0851 98.3 F (36.8 C)     Temp Source 01/12/24 0851 Oral     SpO2 01/12/24 0851 98 %     Weight --      Height --      Head Circumference --      Peak Flow --      Pain Score 01/12/24 0846 7     Pain Loc --      Pain Education --      Exclude from Growth Chart --    No data found.  Updated Vital Signs BP (!) 183/103 (BP Location: Right Arm) Comment: pt takes meds to maintain  Pulse 77   Temp 98.3 F (36.8 C) (Oral)   Resp 18   SpO2 98%   Visual Acuity Right Eye Distance:   Left Eye Distance:   Bilateral Distance:    Right Eye Near:   Left Eye Near:    Bilateral Near:     Physical Exam Constitutional:      General: He is not in acute distress.    Appearance: Normal appearance. He is not toxic-appearing or diaphoretic.  HENT:     Head: Normocephalic and atraumatic.  Eyes:     Extraocular Movements: Extraocular movements intact.     Conjunctiva/sclera: Conjunctivae normal.  Cardiovascular:     Rate and Rhythm: Normal rate and regular rhythm.     Pulses: Normal pulses.     Heart sounds: Normal heart sounds.  Pulmonary:     Effort: Pulmonary effort is normal. No respiratory distress.      Breath sounds: Normal breath sounds.  Chest:     Comments: Mild tenderness to palpation to bilateral lower rib cage.  No obvious crepitus, swelling, discoloration noted. Neurological:     General: No focal deficit present.     Mental Status: He is alert and oriented to person, place, and time. Mental status is at baseline.  Psychiatric:        Mood and Affect: Mood normal.        Behavior: Behavior normal.        Thought Content: Thought content normal.        Judgment: Judgment normal.      UC Treatments / Results  Labs (all labs ordered are listed, but only abnormal results are displayed) Labs Reviewed - No data to display  EKG   Radiology DG Chest 2 View Result Date: 01/12/2024 CLINICAL DATA:  Bilateral rib pain for 2 days. EXAM: CHEST - 2 VIEW COMPARISON:  October 12, 2022. FINDINGS: The heart size and mediastinal contours are within normal limits. Both lungs are clear. The visualized skeletal structures are unremarkable. IMPRESSION: No active cardiopulmonary disease. Electronically Signed   By: Lupita Raider M.D.   On: 01/12/2024 12:48    Procedures Procedures (including critical care time)  Medications Ordered in UC Medications - No data to display  Initial Impression / Assessment and Plan / UC Course  I have reviewed the triage vital signs and the nursing notes.  Pertinent labs & imaging results that were available during my care of the patient were reviewed by me and considered in my medical decision making (see chart for details).     Patient originally stated that he may have been exposed to ricin but  then stated he was not sure the name of it.  After further discussions with the patient and poison control, it appears that patient may have been exposed to rosin which is commonly used in the bonding of tires.  Patient is having some bilateral rib pain, so poison control recommended chest x-ray as this is a common lung irritant.  They advised that symptoms  should resolve but to follow-up if they persist or worsen.  Chest x-ray completed that was negative for any acute cardiopulmonary process.  Patient is sitting upright in chair speaking in complete sentences and oxygen is normal so do not think that emergent evaluation is necessary.  Encouraged patient to speak with his employer to confirm substance he may have been exposed to.  Blood pressure elevated but he has not taken his medications today so do not think it is related to possible exposure.  Encouraged patient to take medication as prescribed and monitor at home.  Advised strict follow-up precautions.  Patient verbalized understanding and was agreeable with plan. Final Clinical Impressions(s) / UC Diagnoses   Final diagnoses:  Rib pain     Discharge Instructions      Chest x-ray is pending.  I will call you if it is abnormal.  Please take your blood pressure medication as your blood pressure is elevated.  Follow-up with your workplace about material you were exposed to.  Follow-up if any symptoms persist or worsen.    ED Prescriptions   None    PDMP not reviewed this encounter.   Gustavus Bryant, [place] 01/12/24 1341

## 2024-01-12 NOTE — ED Triage Notes (Signed)
 Pt reports bilateral pain in ribcage area x2 days. Reports sharp pain with good deep breaths or a cough. Reports it started after a meloxicam dose on Tuesday (has taken the medication before) and also recently finished antibiotic. No other notable changes to routine. Denies bloating, constipation, diarrhea, and N/V.

## 2024-01-12 NOTE — Discharge Instructions (Signed)
 Chest x-ray is pending.  I will call you if it is abnormal.  Please take your blood pressure medication as your blood pressure is elevated.  Follow-up with your workplace about material you were exposed to.  Follow-up if any symptoms persist or worsen.

## 2024-06-27 ENCOUNTER — Ambulatory Visit: Admitting: Endocrinology
